# Patient Record
Sex: Female | Born: 1960 | Race: White | Hispanic: No | Marital: Married | Smoking: Never smoker
Health system: Southern US, Community
[De-identification: ages and names within clinical notes are randomized; demographics above are authoritative.]

## PROBLEM LIST (undated history)

## (undated) DIAGNOSIS — T4145XA Adverse effect of unspecified anesthetic, initial encounter: Secondary | ICD-10-CM

## (undated) DIAGNOSIS — R112 Nausea with vomiting, unspecified: Secondary | ICD-10-CM

## (undated) DIAGNOSIS — M6749 Ganglion, multiple sites: Secondary | ICD-10-CM

## (undated) DIAGNOSIS — M1990 Unspecified osteoarthritis, unspecified site: Secondary | ICD-10-CM

## (undated) DIAGNOSIS — H9319 Tinnitus, unspecified ear: Secondary | ICD-10-CM

## (undated) DIAGNOSIS — T8859XA Other complications of anesthesia, initial encounter: Secondary | ICD-10-CM

## (undated) DIAGNOSIS — D649 Anemia, unspecified: Secondary | ICD-10-CM

## (undated) DIAGNOSIS — I10 Essential (primary) hypertension: Secondary | ICD-10-CM

## (undated) DIAGNOSIS — Z9889 Other specified postprocedural states: Secondary | ICD-10-CM

## (undated) DIAGNOSIS — K219 Gastro-esophageal reflux disease without esophagitis: Secondary | ICD-10-CM

## (undated) HISTORY — PX: ENDOMETRIAL ABLATION: SHX621

## (undated) HISTORY — DX: Ganglion, multiple sites: M67.49

## (undated) HISTORY — DX: Tinnitus, unspecified ear: H93.19

## (undated) HISTORY — PX: ABDOMINAL HYSTERECTOMY: SHX81

---
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
## 2004-07-18 ENCOUNTER — Emergency Department: Payer: Self-pay | Admitting: Emergency Medicine

## 2005-11-22 ENCOUNTER — Ambulatory Visit: Payer: Self-pay | Admitting: Obstetrics and Gynecology

## 2006-07-21 ENCOUNTER — Ambulatory Visit: Payer: Self-pay | Admitting: Obstetrics and Gynecology

## 2007-04-18 ENCOUNTER — Ambulatory Visit: Payer: Self-pay | Admitting: Obstetrics and Gynecology

## 2008-04-17 LAB — HM MAMMOGRAPHY: HM Mammogram: NORMAL

## 2008-04-17 LAB — CONVERTED CEMR LAB: Pap Smear: NORMAL

## 2008-04-30 ENCOUNTER — Ambulatory Visit: Payer: Self-pay | Admitting: Obstetrics and Gynecology

## 2008-08-11 ENCOUNTER — Ambulatory Visit: Payer: Self-pay | Admitting: Family Medicine

## 2008-08-11 DIAGNOSIS — H9319 Tinnitus, unspecified ear: Secondary | ICD-10-CM | POA: Insufficient documentation

## 2008-09-10 ENCOUNTER — Ambulatory Visit: Payer: Self-pay

## 2009-07-03 ENCOUNTER — Ambulatory Visit: Payer: Self-pay | Admitting: Family Medicine

## 2009-07-03 DIAGNOSIS — M6749 Ganglion, multiple sites: Secondary | ICD-10-CM | POA: Insufficient documentation

## 2009-07-07 ENCOUNTER — Encounter: Payer: Self-pay | Admitting: Family Medicine

## 2009-08-06 ENCOUNTER — Ambulatory Visit: Payer: Self-pay | Admitting: Obstetrics and Gynecology

## 2010-08-16 ENCOUNTER — Encounter (INDEPENDENT_AMBULATORY_CARE_PROVIDER_SITE_OTHER): Payer: Self-pay | Admitting: *Deleted

## 2010-08-16 ENCOUNTER — Encounter: Payer: Self-pay | Admitting: Family Medicine

## 2010-08-16 ENCOUNTER — Telehealth: Payer: Self-pay | Admitting: Family Medicine

## 2010-08-25 NOTE — Progress Notes (Signed)
  Phone Note Other Incoming   Details for Reason: Old Records from Dr. Logan Bores Summary of Call: Notify pt she is overdue for a CPX and labs.. CMET, lipids Dx v77.91 please have her schedule.    Last labs per Dr. Logan Bores in 2009.  Initial call taken by: Kerby Nora MD,  August 16, 2010 10:12 AM  Follow-up for Phone Call        Letter mailed to patient with you recommendations Follow-up by: Benny Lennert CMA (AAMA),  August 16, 2010 10:18 AM

## 2010-08-25 NOTE — Letter (Signed)
Summary: Generic Letter  [place] [facility]  [address] [place], [place] [address]   Phone: [phone]  Fax: [phone]    08/16/2010      [facility] [address] [place], [place]  [address]    Dear Ms. Archambault,       After review of you old records Dr. Ermalene Searing suggest you have a physical with labs Cmet,lipids prior to the Physcial       Sincerely,   Benny Lennert CMA (AAMA)

## 2010-09-21 ENCOUNTER — Ambulatory Visit: Payer: Self-pay | Admitting: Obstetrics and Gynecology

## 2010-09-27 ENCOUNTER — Ambulatory Visit: Payer: Self-pay | Admitting: Obstetrics and Gynecology

## 2010-12-10 ENCOUNTER — Ambulatory Visit: Payer: Self-pay | Admitting: Family Medicine

## 2011-03-31 ENCOUNTER — Ambulatory Visit: Payer: Self-pay | Admitting: Obstetrics and Gynecology

## 2011-09-22 ENCOUNTER — Encounter: Payer: Self-pay | Admitting: Family Medicine

## 2011-09-22 ENCOUNTER — Ambulatory Visit (INDEPENDENT_AMBULATORY_CARE_PROVIDER_SITE_OTHER): Payer: Managed Care, Other (non HMO) | Admitting: Family Medicine

## 2011-09-22 VITALS — BP 131/90 | HR 64 | Temp 98.1°F | Wt 142.0 lb

## 2011-09-22 DIAGNOSIS — R1011 Right upper quadrant pain: Secondary | ICD-10-CM | POA: Insufficient documentation

## 2011-09-22 LAB — CBC WITH DIFFERENTIAL/PLATELET
Basophils Relative: 0.3 % (ref 0.0–3.0)
HCT: 41.8 % (ref 36.0–46.0)
Hemoglobin: 13.7 g/dL (ref 12.0–15.0)
Lymphocytes Relative: 31 % (ref 12.0–46.0)
Lymphs Abs: 2.2 10*3/uL (ref 0.7–4.0)
Monocytes Relative: 7.9 % (ref 3.0–12.0)
Neutro Abs: 4.1 10*3/uL (ref 1.4–7.7)
RBC: 4.86 Mil/uL (ref 3.87–5.11)
RDW: 13.3 % (ref 11.5–14.6)

## 2011-09-22 LAB — HEPATIC FUNCTION PANEL
ALT: 16 U/L (ref 0–35)
AST: 16 U/L (ref 0–37)
Albumin: 4.2 g/dL (ref 3.5–5.2)
Alkaline Phosphatase: 53 U/L (ref 39–117)
Total Protein: 7 g/dL (ref 6.0–8.3)

## 2011-09-22 MED ORDER — OMEPRAZOLE 40 MG PO CPDR: DELAYED_RELEASE_CAPSULE | ORAL | Status: DC

## 2011-09-22 NOTE — Progress Notes (Signed)
  Subjective:    Patient ID: Karla Boyer, female    DOB: [birth year], 51 y.o.   MRN: [id number]  HPI 51 yo with epigastric and RUQ pain. Has had intermittent symptoms for past year but getting progressively worse. Symptoms are worst at night. Does have some reflux symptoms. No fever but did have chills a few nights ago.  Does not drink ETOH, coffee, or smoke cigarettes.  Tums is not relieving pain.  Denies any dark/tary stools. No blood in stool.  No CP.  Patient Active Problem List  Diagnoses  . TINNITUS, RIGHT  . OTHER GANGLION&CYST OF SYNOVIUM TENDON&BURSA  . Right upper quadrant pain   Past Medical History  Diagnosis Date  . Other ganglion and cyst of synovium, tendon, and bursa   . Unspecified tinnitus    Past Surgical History  Procedure Date  . Endometrial ablation    History  Substance Use Topics  . Smoking status: Never Smoker   . Smokeless tobacco: Not on file  . Alcohol Use: No   Family History  Problem Relation Age of Onset  . Diabetes Maternal Grandfather   . Cancer Paternal Grandmother     bone, age 14's   No Known Allergies No current outpatient prescriptions on file prior to visit.   The PMH, PSH, Social History, Family History, Medications, and allergies have been reviewed in [facility], and have been updated if relevant.    Review of Systems See HPI   Objective:   Physical Exam BP 131/90  Pulse 64  Temp(Src) 98.1 F (36.7 C) (Oral)  Wt 142 lb (64.411 kg)  General:  Well-developed,well-nourished,in no acute distress; alert,appropriate and cooperative throughout examination Head:  normocephalic and atraumatic.   Eyes:  vision grossly intact, pupils equal, pupils round, and pupils reactive to light.   Ears:  R ear normal and L ear normal.   Nose:  no external deformity.   Mouth:  good dentition.   Neck:  No deformities, masses, or tenderness noted. Abdomen:  Bowel sounds positive,abdomen soft and non-tender without masses, organomegaly, neg  murphy's sign Neurologic:  alert & oriented X3 and gait normal.   Skin:  Intact without suspicious lesions or rashes Psych:  Cognition and judgment appear intact. Alert and cooperative with normal attention span and concentration. No apparent delusions, illusions, hallucinations    Assessment & Plan:   1. Right upper quadrant pain  US Abdomen Complete, CBC with Differential, Hepatic Function Panel   New with epigastric pain. GERD vs biliary colic. Omeprazole 40 mg nightly x 2 weeks. Will also check abdominal US to rule out biliary process- unlikely cholecystitis based on exam today. Check CBC and hepatic function panel as well.

## 2011-09-22 NOTE — Patient Instructions (Signed)
Good to see you. Please take Omeprazole as directed- 1 tablet nightly for two weeks and then call with an update. Please stop by to see Shirlee Limerick after you go to the lab.

## 2011-09-23 ENCOUNTER — Ambulatory Visit: Payer: Self-pay | Admitting: Family Medicine

## 2011-09-26 ENCOUNTER — Encounter: Payer: Self-pay | Admitting: Family Medicine

## 2011-09-30 ENCOUNTER — Ambulatory Visit: Payer: Self-pay | Admitting: Obstetrics and Gynecology

## 2012-02-15 ENCOUNTER — Ambulatory Visit (INDEPENDENT_AMBULATORY_CARE_PROVIDER_SITE_OTHER): Payer: Managed Care, Other (non HMO) | Admitting: Family Medicine

## 2012-02-15 ENCOUNTER — Encounter: Payer: Self-pay | Admitting: Family Medicine

## 2012-02-15 VITALS — BP 120/74 | HR 79 | Temp 97.6°F | Ht 63.5 in | Wt 144.2 lb

## 2012-02-15 DIAGNOSIS — J309 Allergic rhinitis, unspecified: Secondary | ICD-10-CM

## 2012-02-15 DIAGNOSIS — J0100 Acute maxillary sinusitis, unspecified: Secondary | ICD-10-CM

## 2012-02-15 MED ORDER — FLUTICASONE PROPIONATE 50 MCG/ACT NA SUSP: 2.0000 | Freq: Every day | NASAL | Status: DC

## 2012-02-15 MED ORDER — AMOXICILLIN-POT CLAVULANATE 875-125 MG PO TABS: 1.0000 | ORAL_TABLET | Freq: Two times a day (BID) | ORAL | Status: AC

## 2012-02-15 NOTE — Progress Notes (Signed)
   Nature conservation officer at [facility] [address] [place] [address] Phone: [phone] Fax: [phone]  Date:  02/15/2012   Name:  Karla Boyer   DOB:  January 12, 1961   MRN:  [id number]  PCP:  Kerby Nora, MD    Chief Complaint: Sinusitis   History of Present Illness:  Vineta Buttrey is a 51 y.o. very pleasant female patient who presents with the following:  Has been battling symptoms for > 1 month. Some pain in the teeth.  Has had some nasal allergies in the past.   Sinus Pain: Patient complains of congestion, facial pain, headache described as frontal / max, nasal congestion, purulent nasal discharge, sinus pressure, sore throat and tooth pain. Symptoms include above with no fever, chills, night sweats or weight loss. Onset of symptoms was 1 month ago, gradually worsening since that time. She is drinking plenty of fluids.  Past history is significant for allergic rhinitis. Patient is non-smoker   Past Medical History, Surgical History, Social History, Family History, Problem List, Medications, and Allergies have been reviewed and updated if relevant.  No current outpatient prescriptions on file prior to visit.    Review of Systems: ROS: GEN: Acute illness details above GI: Tolerating PO intake GU: maintaining adequate hydration and urination Pulm: No SOB Interactive and getting along well at home.  Otherwise, ROS is as per the HPI.   Physical Examination: Filed Vitals:   02/15/12 0926  BP: 120/74  Pulse: 79  Temp: 97.6 F (36.4 C)   Filed Vitals:   02/15/12 0926  Height: 5' 3.5" (1.613 m)  Weight: 144 lb 4 oz (65.431 kg)   Body mass index is 25.15 kg/(m^2). Ideal Body Weight: Weight in (lb) to have BMI = 25: 143.1    Gen: WDWN, NAD; alert,appropriate and cooperative throughout exam  HEENT: Normocephalic and atraumatic. Throat clear, w/o exudate, no LAD, R TM clear, L TM - good landmarks, No fluid present. rhinnorhea.  Left frontal and maxillary sinuses:  nonTender Right frontal and maxillary sinuses: TenderRIGHT max  Neck: No ant or post LAD CV: RRR, No M/G/R Pulm: Breathing comfortably in no resp distress. no w/c/r Abd: S,NT,ND,+BS Extr: no c/c/e Psych: full affect, pleasant   Assessment and Plan:  1. Maxillary sinusitis, acute   2. Allergic rhinitis    Acute sinusitis: ABX as below.  Refer to the patient instructions sections for details of plan shared with patient.  Reviewed symptomatic care as well as ABX in this case.   flonase trial and sudafed  Orders Today:  No orders of the defined types were placed in this encounter.    Medications Today: (Includes new updates added during medication reconciliation) Meds ordered this encounter  Medications  . amoxicillin-clavulanate (AUGMENTIN) 875-125 MG per tablet    Sig: Take 1 tablet by mouth 2 (two) times daily.    Dispense:  20 tablet    Refill:  0  . fluticasone (FLONASE) 50 MCG/ACT nasal spray    Sig: Place 2 sprays into the nose daily.    Dispense:  16 g    Refill:  12     Hannah Beat, MD

## 2012-10-24 ENCOUNTER — Ambulatory Visit: Payer: Self-pay | Admitting: Otolaryngology

## 2013-04-17 ENCOUNTER — Ambulatory Visit (INDEPENDENT_AMBULATORY_CARE_PROVIDER_SITE_OTHER): Payer: Managed Care, Other (non HMO) | Admitting: Internal Medicine

## 2013-04-17 DIAGNOSIS — Z299 Encounter for prophylactic measures, unspecified: Secondary | ICD-10-CM

## 2013-04-17 DIAGNOSIS — Z23 Encounter for immunization: Secondary | ICD-10-CM

## 2013-04-17 DIAGNOSIS — Z789 Other specified health status: Secondary | ICD-10-CM

## 2013-04-17 MED ORDER — ATOVAQUONE-PROGUANIL HCL 250-100 MG PO TABS: 1.0000 | ORAL_TABLET | Freq: Every day | ORAL | Status: DC

## 2013-04-17 MED ORDER — AZITHROMYCIN 500 MG PO TABS: 500.0000 mg | ORAL_TABLET | Freq: Every day | ORAL | Status: DC

## 2013-04-17 NOTE — Progress Notes (Signed)
[facility]   RFV: travel to [place], [place] from oct 29th for 12 day business trip Subjective:    Patient ID: Karla Boyer, female    DOB: August 03, 1960, 52 y.o.   MRN: [id number]  HPI Kayelynn is a 52yo F with no significant past med history, not on any medications, no allergies. Going to [place] for the first time.  Previous travel to the [place]  Review of Systems     Objective:   Physical Exam        Assessment & Plan:  Travel vaccines = will give hep A, typhoid, tdap, and influenza  Malaria proph = wil give #21 tabs of malarone  Traveler's diarrhea = will give rx for azithromycin

## 2013-04-26 ENCOUNTER — Encounter: Payer: Managed Care, Other (non HMO) | Admitting: Internal Medicine

## 2013-04-29 ENCOUNTER — Ambulatory Visit: Payer: Self-pay | Admitting: Obstetrics and Gynecology

## 2013-04-30 ENCOUNTER — Encounter: Payer: Self-pay | Admitting: Family Medicine

## 2013-04-30 ENCOUNTER — Ambulatory Visit (INDEPENDENT_AMBULATORY_CARE_PROVIDER_SITE_OTHER): Payer: Managed Care, Other (non HMO) | Admitting: Family Medicine

## 2013-04-30 VITALS — BP 140/90 | HR 69 | Temp 98.1°F | Ht 63.5 in | Wt 141.5 lb

## 2013-04-30 DIAGNOSIS — R030 Elevated blood-pressure reading, without diagnosis of hypertension: Secondary | ICD-10-CM

## 2013-04-30 DIAGNOSIS — I10 Essential (primary) hypertension: Secondary | ICD-10-CM | POA: Insufficient documentation

## 2013-04-30 LAB — POCT URINALYSIS DIPSTICK
Bilirubin, UA: NEGATIVE
Glucose, UA: NEGATIVE
Leukocytes, UA: NEGATIVE
Nitrite, UA: NEGATIVE
Protein, UA: NEGATIVE
Spec Grav, UA: <=1.005
pH, UA: 7

## 2013-04-30 NOTE — Assessment & Plan Note (Addendum)
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 Will eval  for secondary causes, end organ damage and risk factors. Send for fasting labs.  Get cuff follow at home . Return in 2 weeks for re-eval and possible need for medication.  EKG nml, UA no protein.

## 2013-04-30 NOTE — Patient Instructions (Signed)
Return for fasting labs.  Follow BP at home, record.  Work on healthy heart diet... Can look on american heart association website for more info on diet for HTN.  Follow up BP in 2 weeks.

## 2013-04-30 NOTE — Progress Notes (Signed)
  Subjective:    Patient ID: Karla Boyer, female    DOB: 1961/06/11, 52 y.o.   MRN: [id number]  HPI   52 year old female presents with recently elevated BPS here today and [facility], at other MD ... 137/85-140/92 in last month. She has been feeling well overall. No headache, no bliurred vision, no numbness, no weakness, no dizziness.  No cp, and SOB. No edema.    Slight increase in stress but not much.  No weight gain.  No changes in eating habits.  Wt Readings from Last 3 Encounters:  04/30/13 141 lb 8 oz (64.184 kg)  02/15/12 144 lb 4 oz (65.431 kg)  09/22/11 142 lb (64.411 kg)   Last Gyn visit for CPX in last week. Nml pelvic, nml  Pap. Pending mammogram. Lipids were good per pt. No discussion on colon cancer screening.  Walking 3 days a week 30 mins. Avoids salt, eats out some.   Family history: mother with HTN.      Review of Systems  Constitutional: Negative for fever and fatigue.  HENT: Negative for ear pain.   Eyes: Negative for pain.  Respiratory: Negative for chest tightness and shortness of breath.   Cardiovascular: Negative for chest pain, palpitations and leg swelling.  Gastrointestinal: Negative for abdominal pain.  Genitourinary: Negative for dysuria.       Objective:   Physical Exam  Constitutional: Vital signs are normal. She appears well-developed and well-nourished. She is cooperative.  Non-toxic appearance. She does not appear ill. No distress.  HENT:  Head: Normocephalic.  Right Ear: Hearing, tympanic membrane, external ear and ear canal normal. Tympanic membrane is not erythematous, not retracted and not bulging.  Left Ear: Hearing, tympanic membrane, external ear and ear canal normal. Tympanic membrane is not erythematous, not retracted and not bulging.  Nose: No mucosal edema or rhinorrhea. Right sinus exhibits no maxillary sinus tenderness and no frontal sinus tenderness. Left sinus exhibits no maxillary sinus tenderness and no frontal  sinus tenderness.  Mouth/Throat: Uvula is midline, oropharynx is clear and moist and mucous membranes are normal.  Eyes: Conjunctivae, EOM and lids are normal. Pupils are equal, round, and reactive to light. Lids are everted and swept, no foreign bodies found.  No papilledema, no AV nicking or changes from BP elevation.  Neck: Trachea normal and normal range of motion. Neck supple. Carotid bruit is not present. No mass and no thyromegaly present.  Cardiovascular: Normal rate, regular rhythm, S1 normal, S2 normal, normal heart sounds, intact distal pulses and normal pulses.  Exam reveals no gallop and no friction rub.   No murmur heard. Pulmonary/Chest: Effort normal and breath sounds normal. Not tachypneic. No respiratory distress. She has no decreased breath sounds. She has no wheezes. She has no rhonchi. She has no rales.  Abdominal: Soft. Normal appearance and bowel sounds are normal. There is no tenderness.  Neurological: She is alert.  Skin: Skin is warm, dry and intact. No rash noted.  Psychiatric: Her speech is normal and behavior is normal. Judgment and thought content normal. Her mood appears not anxious. Cognition and memory are normal. She does not exhibit a depressed mood.          Assessment & Plan:

## 2013-05-01 ENCOUNTER — Other Ambulatory Visit (INDEPENDENT_AMBULATORY_CARE_PROVIDER_SITE_OTHER): Payer: Managed Care, Other (non HMO)

## 2013-05-01 DIAGNOSIS — R030 Elevated blood-pressure reading, without diagnosis of hypertension: Secondary | ICD-10-CM

## 2013-05-01 LAB — COMPREHENSIVE METABOLIC PANEL
ALT: 19 U/L (ref 0–35)
Albumin: 4.1 g/dL (ref 3.5–5.2)
Alkaline Phosphatase: 57 U/L (ref 39–117)
BUN: 11 mg/dL (ref 6–23)
CO2: 28 mEq/L (ref 19–32)
Calcium: 9.3 mg/dL (ref 8.4–10.5)
Chloride: 103 mEq/L (ref 96–112)
GFR: 72.22 mL/min (ref >60.00)
Glucose, Bld: 104 mg/dL — ABNORMAL HIGH (ref 70–99)
Potassium: 4.6 mEq/L (ref 3.5–5.1)
Sodium: 139 mEq/L (ref 135–145)
Total Protein: 7.2 g/dL (ref 6.0–8.3)

## 2013-05-01 LAB — CBC WITH DIFFERENTIAL/PLATELET
Basophils Absolute: 0 10*3/uL (ref 0.0–0.1)
Basophils Relative: 0.5 % (ref 0.0–3.0)
HCT: 44.1 % (ref 36.0–46.0)
Lymphs Abs: 2.2 10*3/uL (ref 0.7–4.0)
Monocytes Relative: 8.3 % (ref 3.0–12.0)
Neutrophils Relative %: 59.9 % (ref 43.0–77.0)
Platelets: 268 10*3/uL (ref 150.0–400.0)
RDW: 13.5 % (ref 11.5–14.6)

## 2013-05-01 LAB — TSH: TSH: 1.59 u[IU]/mL (ref 0.35–5.50)

## 2013-05-08 ENCOUNTER — Other Ambulatory Visit: Payer: Self-pay | Admitting: Family Medicine

## 2013-05-08 DIAGNOSIS — Z13220 Encounter for screening for lipoid disorders: Secondary | ICD-10-CM

## 2013-05-09 ENCOUNTER — Encounter: Payer: Self-pay | Admitting: *Deleted

## 2013-05-09 ENCOUNTER — Other Ambulatory Visit (INDEPENDENT_AMBULATORY_CARE_PROVIDER_SITE_OTHER): Payer: Managed Care, Other (non HMO)

## 2013-05-09 DIAGNOSIS — R030 Elevated blood-pressure reading, without diagnosis of hypertension: Secondary | ICD-10-CM

## 2013-05-09 DIAGNOSIS — Z13220 Encounter for screening for lipoid disorders: Secondary | ICD-10-CM

## 2013-05-09 LAB — LIPID PANEL
HDL: 39.5 mg/dL (ref >39.00)
LDL Cholesterol: 108 mg/dL — ABNORMAL HIGH (ref 0–99)
Total CHOL/HDL Ratio: 4

## 2013-05-14 ENCOUNTER — Encounter: Payer: Self-pay | Admitting: Family Medicine

## 2013-05-14 ENCOUNTER — Ambulatory Visit (INDEPENDENT_AMBULATORY_CARE_PROVIDER_SITE_OTHER): Payer: Managed Care, Other (non HMO) | Admitting: Family Medicine

## 2013-05-14 VITALS — BP 116/76 | HR 72 | Temp 97.9°F | Ht 63.5 in | Wt 142.0 lb

## 2013-05-14 DIAGNOSIS — R7303 Prediabetes: Secondary | ICD-10-CM

## 2013-05-14 DIAGNOSIS — R030 Elevated blood-pressure reading, without diagnosis of hypertension: Secondary | ICD-10-CM

## 2013-05-14 DIAGNOSIS — R7309 Other abnormal glucose: Secondary | ICD-10-CM

## 2013-05-14 NOTE — Assessment & Plan Note (Signed)
Counseled on lifestyle changes.. Low carb diet.

## 2013-05-14 NOTE — Progress Notes (Signed)
  Subjective:    Patient ID: Karla Boyer, female    DOB: Jul 01, 1961, 52 y.o.   MRN: [id number]  HPI  52 year old female presetns for follow up BP.   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 Will eval for secondary causes, end organ damage and risk factors. Send for fasting labs.  Get cuff follow at home . Return in 2 weeks for re-eval and possible need for medication.  EKG nml, UA no protein.  Labs showed great control cholesterol LDL < 130.  Nml cbc, CMET nml except mild prediabtes.  Today pt reports she has not taken BP since she was here last. She has been trying to eat healthier.  She is working on lower carb and lower sodium diet.  Wt Readings from Last 3 Encounters:  05/14/13 142 lb (64.411 kg)  04/30/13 141 lb 8 oz (64.184 kg)  02/15/12 144 lb 4 oz (65.431 kg)       Review of Systems  Constitutional: Negative for fever and fatigue.  HENT: Negative for ear pain.   Eyes: Negative for pain.  Respiratory: Negative for chest tightness and shortness of breath.   Cardiovascular: Negative for chest pain, palpitations and leg swelling.  Gastrointestinal: Negative for abdominal pain.  Genitourinary: Negative for dysuria.       Objective:   Physical Exam  Constitutional: Vital signs are normal. She appears well-developed and well-nourished. She is cooperative.  Non-toxic appearance. She does not appear ill. No distress.  HENT:  Head: Normocephalic.  Right Ear: Hearing, tympanic membrane, external ear and ear canal normal. Tympanic membrane is not erythematous, not retracted and not bulging.  Left Ear: Hearing, tympanic membrane, external ear and ear canal normal. Tympanic membrane is not erythematous, not retracted and not bulging.  Nose: No mucosal edema or rhinorrhea. Right sinus exhibits no maxillary sinus tenderness and no frontal sinus tenderness. Left sinus exhibits no maxillary sinus tenderness and no frontal sinus  tenderness.  Mouth/Throat: Uvula is midline, oropharynx is clear and moist and mucous membranes are normal.  Eyes: Conjunctivae, EOM and lids are normal. Pupils are equal, round, and reactive to light. Lids are everted and swept, no foreign bodies found.  Neck: Trachea normal and normal range of motion. Neck supple. Carotid bruit is not present. No mass and no thyromegaly present.  Cardiovascular: Normal rate, regular rhythm, S1 normal, S2 normal, normal heart sounds, intact distal pulses and normal pulses.  Exam reveals no gallop and no friction rub.   No murmur heard. Pulmonary/Chest: Effort normal and breath sounds normal. Not tachypneic. No respiratory distress. She has no decreased breath sounds. She has no wheezes. She has no rhonchi. She has no rales.  Abdominal: Soft. Normal appearance and bowel sounds are normal. There is no tenderness.  Neurological: She is alert.  Skin: Skin is warm, dry and intact. No rash noted.  Psychiatric: Her speech is normal and behavior is normal. Judgment and thought content normal. Her mood appears not anxious. Cognition and memory are normal. She does not exhibit a depressed mood.          Assessment & Plan:

## 2013-05-14 NOTE — Assessment & Plan Note (Signed)
Improved control with  Healthier eating. Continue to follow at home. Recheck in office in 6 months.

## 2013-05-14 NOTE — Patient Instructions (Signed)
Return for BP check in 6 months. Keep working on exercise and  healthy eating habits.

## 2013-05-20 ENCOUNTER — Telehealth: Payer: Self-pay | Admitting: *Deleted

## 2013-05-20 NOTE — Telephone Encounter (Signed)
Sent email to Anise Salvo to delete [employer] charge of $50 from [employer] account.  She has already paid for it.

## 2013-11-12 ENCOUNTER — Ambulatory Visit: Payer: Managed Care, Other (non HMO) | Admitting: Family Medicine

## 2013-11-12 DIAGNOSIS — Z0289 Encounter for other administrative examinations: Secondary | ICD-10-CM

## 2014-02-21 ENCOUNTER — Encounter: Payer: Self-pay | Admitting: Family Medicine

## 2014-02-21 ENCOUNTER — Ambulatory Visit (INDEPENDENT_AMBULATORY_CARE_PROVIDER_SITE_OTHER): Payer: Managed Care, Other (non HMO) | Admitting: Family Medicine

## 2014-02-21 VITALS — BP 152/96 | HR 69 | Temp 98.3°F | Ht 63.5 in | Wt 141.5 lb

## 2014-02-21 DIAGNOSIS — I10 Essential (primary) hypertension: Secondary | ICD-10-CM

## 2014-02-21 DIAGNOSIS — R0789 Other chest pain: Secondary | ICD-10-CM

## 2014-02-21 MED ORDER — LOSARTAN POTASSIUM-HCTZ 50-12.5 MG PO TABS: 1.0000 | ORAL_TABLET | Freq: Every day | ORAL | Status: DC

## 2014-02-21 NOTE — Assessment & Plan Note (Addendum)
Likely due to HTN and stress.  EKG: NSR, no ST changes, no LVH.

## 2014-02-21 NOTE — Assessment & Plan Note (Signed)
Start losartan HCTZ. Encouraged exercise, weight loss, healthy eating habits. Follow up in 7-10 days for BP re-eval and CR check on ARB.

## 2014-02-21 NOTE — Progress Notes (Signed)
   Subjective:    Patient ID: Karla Boyer, female    DOB: [birth year], 53 y.o.   MRN: [id number]  HPI  53 year old female seen last year with elevated BP.  EKG nml, UA no protein. CBC, lipids and CMET at goal 04/2013. Her BP initially improved with lfestyle cahnge, but she has now noticed an increase back up.  She has noted BP 146-155/91-103  In last few weeks she had increase in stress. She started feeling ill. Chest tightness, neck tightness.  Intermittant, occurs with stress. She can feel BP rising. Mild SOB.  Occ heart racing.  No exercise associated chest pain.  Can hear heart beating in both ears. No swelling in ankle.   She has started on DASH diet.  Mother with HTN.   Wt Readings from Last 3 Encounters:  02/21/14 141 lb 8 oz (64.184 kg)  05/14/13 142 lb (64.411 kg)  04/30/13 141 lb 8 oz (64.184 kg)        Review of Systems  Constitutional: Negative for fever and fatigue.  HENT: Negative for ear pain.   Eyes: Negative for pain.  Respiratory: Positive for shortness of breath. Negative for chest tightness.   Cardiovascular: Positive for chest pain and palpitations. Negative for leg swelling.  Gastrointestinal: Negative for abdominal pain.  Genitourinary: Negative for dysuria.       Objective:   Physical Exam  Constitutional: Vital signs are normal. She appears well-developed and well-nourished. She is cooperative.  Non-toxic appearance. She does not appear ill. No distress.  HENT:  Head: Normocephalic.  Right Ear: Hearing, tympanic membrane, external ear and ear canal normal. Tympanic membrane is not erythematous, not retracted and not bulging.  Left Ear: Hearing, tympanic membrane, external ear and ear canal normal. Tympanic membrane is not erythematous, not retracted and not bulging.  Nose: No mucosal edema or rhinorrhea. Right sinus exhibits no maxillary sinus tenderness and no frontal sinus tenderness. Left sinus exhibits no maxillary sinus tenderness and no  frontal sinus tenderness.  Mouth/Throat: Uvula is midline, oropharynx is clear and moist and mucous membranes are normal.  Eyes: Conjunctivae, EOM and lids are normal. Pupils are equal, round, and reactive to light. Lids are everted and swept, no foreign bodies found.  Neck: Trachea normal and normal range of motion. Neck supple. Carotid bruit is not present. No mass and no thyromegaly present.  Cardiovascular: Normal rate, regular rhythm, S1 normal, S2 normal, normal heart sounds, intact distal pulses and normal pulses.  Exam reveals no gallop and no friction rub.   No murmur heard. Pulmonary/Chest: Effort normal and breath sounds normal. Not tachypneic. No respiratory distress. She has no decreased breath sounds. She has no wheezes. She has no rhonchi. She has no rales.  Abdominal: Soft. Normal appearance and bowel sounds are normal. There is no tenderness.  Neurological: She is alert.  Skin: Skin is warm, dry and intact. No rash noted.  Psychiatric: Her speech is normal and behavior is normal. Judgment and thought content normal. Her mood appears not anxious. Cognition and memory are normal. She does not exhibit a depressed mood.          Assessment & Plan:

## 2014-02-21 NOTE — Patient Instructions (Addendum)
Start measuring BP at home at least daily.  Record measurements.  Start losartan HCTZ.  Return in 1-2 weeks for a BP follow up appt .   Hypertension Hypertension, commonly called high blood pressure, is when the force of blood pumping through your arteries is too strong. Your arteries are the blood vessels that carry blood from your heart throughout your body. A blood pressure reading consists of a higher number over a lower number, such as 110/72. The higher number (systolic) is the pressure inside your arteries when your heart pumps. The lower number (diastolic) is the pressure inside your arteries when your heart relaxes. Ideally you want your blood pressure below 120/80. Hypertension forces your heart to work harder to pump blood. Your arteries may become narrow or stiff. Having hypertension puts you at risk for heart disease, stroke, and other problems.  RISK FACTORS Some risk factors for high blood pressure are controllable. Others are not.  Risk factors you cannot control include:   Race. You may be at higher risk if you are African American.  Age. Risk increases with age.  Gender. Men are at higher risk than women before age 38 years. After age 75, women are at higher risk than men. Risk factors you can control include:  Not getting enough exercise or physical activity.  Being overweight.  Getting too much fat, sugar, calories, or salt in your diet.  Drinking too much alcohol. SIGNS AND SYMPTOMS Hypertension does not usually cause signs or symptoms. Extremely high blood pressure (hypertensive crisis) may cause headache, anxiety, shortness of breath, and nosebleed. DIAGNOSIS  To check if you have hypertension, your health care provider will measure your blood pressure while you are seated, with your arm held at the level of your heart. It should be measured at least twice using the same arm. Certain conditions can cause a difference in blood pressure between your right and left  arms. A blood pressure reading that is higher than normal on one occasion does not mean that you need treatment. If one blood pressure reading is high, ask your health care provider about having it checked again. TREATMENT  Treating high blood pressure includes making lifestyle changes and possibly taking medicine. Living a healthy lifestyle can help lower high blood pressure. You may need to change some of your habits. Lifestyle changes may include:  Following the DASH diet. This diet is high in fruits, vegetables, and whole grains. It is low in salt, red meat, and added sugars.  Getting at least 2 hours of brisk physical activity every week.  Losing weight if necessary.  Not smoking.  Limiting alcoholic beverages.  Learning ways to reduce stress. If lifestyle changes are not enough to get your blood pressure under control, your health care provider may prescribe medicine. You may need to take more than one. Work closely with your health care provider to understand the risks and benefits. HOME CARE INSTRUCTIONS  Have your blood pressure rechecked as directed by your health care provider.   Take medicines only as directed by your health care provider. Follow the directions carefully. Blood pressure medicines must be taken as prescribed. The medicine does not work as well when you skip doses. Skipping doses also puts you at risk for problems.   Do not smoke.   Monitor your blood pressure at home as directed by your health care provider. SEEK MEDICAL CARE IF:   You think you are having a reaction to medicines taken.  You have recurrent headaches or  feel dizzy.  You have swelling in your ankles.  You have trouble with your vision. SEEK IMMEDIATE MEDICAL CARE IF:  You develop a severe headache or confusion.  You have unusual weakness, numbness, or feel faint.  You have severe chest or abdominal pain.  You vomit repeatedly.  You have trouble breathing. MAKE SURE YOU:    Understand these instructions.  Will watch your condition.  Will get help right away if you are not doing well or get worse. Document Released: 07/04/2005 Document Revised: 11/18/2013 Document Reviewed: 04/26/2013 [facility] Patient Information 2015 [place], [place]. This information is not intended to replace advice given to you by your health care provider. Make sure you discuss any questions you have with your health care provider.

## 2014-02-21 NOTE — Progress Notes (Signed)
Pre visit review using our clinic review tool, if applicable. No additional management support is needed unless otherwise documented below in the visit note. 

## 2014-02-24 ENCOUNTER — Telehealth: Payer: Self-pay | Admitting: Family Medicine

## 2014-02-24 NOTE — Telephone Encounter (Signed)
Relevant patient education mailed to patient.  

## 2014-03-07 ENCOUNTER — Ambulatory Visit (INDEPENDENT_AMBULATORY_CARE_PROVIDER_SITE_OTHER): Payer: Managed Care, Other (non HMO) | Admitting: Family Medicine

## 2014-03-07 ENCOUNTER — Encounter: Payer: Self-pay | Admitting: Family Medicine

## 2014-03-07 ENCOUNTER — Ambulatory Visit: Payer: Managed Care, Other (non HMO) | Admitting: Family Medicine

## 2014-03-07 VITALS — BP 116/79 | HR 79 | Temp 98.2°F | Ht 63.5 in | Wt 142.5 lb

## 2014-03-07 DIAGNOSIS — I10 Essential (primary) hypertension: Secondary | ICD-10-CM

## 2014-03-07 MED ORDER — LOSARTAN POTASSIUM-HCTZ 50-12.5 MG PO TABS: 1.0000 | ORAL_TABLET | Freq: Every day | ORAL | Status: DC

## 2014-03-07 MED ORDER — LOSARTAN POTASSIUM-HCTZ 50-12.5 MG PO TABS
1.0000 | ORAL_TABLET | Freq: Every day | ORAL | Status: DC
Start: 2014-03-07 — End: 2015-04-06

## 2014-03-07 NOTE — Progress Notes (Signed)
   Subjective:    Patient ID: Karla Boyer, female    DOB: 10/29/1960, 53 y.o.   MRN: [id number]  HPI 53 year old female presents for Bp follow up.  She was seen  On 02/21/2014 and started on losartan HCTZ.  Since then she reports her BP is much better... 100-117/71-80. No SE to the medication. Chest pressure has resolved.  BP Readings from Last 3 Encounters:  03/07/14 116/79  02/21/14 152/96  05/14/13 116/76     Review of Systems  Constitutional: Negative for fever and fatigue.  HENT: Negative for ear pain.   Eyes: Negative for pain.  Respiratory: Negative for chest tightness and shortness of breath.   Cardiovascular: Negative for chest pain, palpitations and leg swelling.  Gastrointestinal: Negative for abdominal pain.  Genitourinary: Negative for dysuria.       Objective:   Physical Exam  Constitutional: Vital signs are normal. She appears well-developed and well-nourished. She is cooperative.  Non-toxic appearance. She does not appear ill. No distress.  HENT:  Head: Normocephalic.  Right Ear: Hearing, tympanic membrane, external ear and ear canal normal. Tympanic membrane is not erythematous, not retracted and not bulging.  Left Ear: Hearing, tympanic membrane, external ear and ear canal normal. Tympanic membrane is not erythematous, not retracted and not bulging.  Nose: No mucosal edema or rhinorrhea. Right sinus exhibits no maxillary sinus tenderness and no frontal sinus tenderness. Left sinus exhibits no maxillary sinus tenderness and no frontal sinus tenderness.  Mouth/Throat: Uvula is midline, oropharynx is clear and moist and mucous membranes are normal.  Eyes: Conjunctivae, EOM and lids are normal. Pupils are equal, round, and reactive to light. Lids are everted and swept, no foreign bodies found.  Neck: Trachea normal and normal range of motion. Neck supple. Carotid bruit is not present. No mass and no thyromegaly present.  Cardiovascular: Normal rate, regular  rhythm, S1 normal, S2 normal, normal heart sounds, intact distal pulses and normal pulses.  Exam reveals no gallop and no friction rub.   No murmur heard. Pulmonary/Chest: Effort normal and breath sounds normal. Not tachypneic. No respiratory distress. She has no decreased breath sounds. She has no wheezes. She has no rhonchi. She has no rales.  Abdominal: Soft. Normal appearance and bowel sounds are normal. There is no tenderness.  Neurological: She is alert.  Skin: Skin is warm, dry and intact. No rash noted.  Psychiatric: Her speech is normal and behavior is normal. Judgment and thought content normal. Her mood appears not anxious. Cognition and memory are normal. She does not exhibit a depressed mood.          Assessment & Plan:

## 2014-03-07 NOTE — Progress Notes (Signed)
Pre visit review using our clinic review tool, if applicable. No additional management support is needed unless otherwise documented below in the visit note. 

## 2014-03-07 NOTE — Patient Instructions (Addendum)
Follow BP at home, goal < 140/90. Call if < 100/60 and associated with SE like dizziness. Stop at lab on way out.

## 2014-03-07 NOTE — Assessment & Plan Note (Signed)
Well controlled on losartan HCTZ.  Follow at home.  Check Cr for ARF SE.

## 2014-03-08 LAB — BASIC METABOLIC PANEL
BUN: 10 mg/dL (ref 6–23)
CHLORIDE: 101 meq/L (ref 96–112)
CO2: 28 mEq/L (ref 19–32)
CREATININE: 0.8 mg/dL (ref 0.4–1.2)
Calcium: 9.1 mg/dL (ref 8.4–10.5)
GFR: 77 mL/min (ref >60.00)
Glucose, Bld: 99 mg/dL (ref 70–99)
Potassium: 4.3 mEq/L (ref 3.5–5.1)
Sodium: 140 mEq/L (ref 135–145)

## 2014-10-30 ENCOUNTER — Telehealth: Payer: Self-pay | Admitting: Family Medicine

## 2014-10-30 NOTE — Telephone Encounter (Signed)
She is pass due to an appointment.  Please call and schedule an office visit with Dr. Diona Browner.

## 2014-10-30 NOTE — Telephone Encounter (Signed)
Pt needs a stool colon screening card for her insurance.  Can pt pick this up at her convenience? Work number is [phone]. Thanks.

## 2014-10-30 NOTE — Telephone Encounter (Signed)
Tried calling pt, no answer.

## 2014-10-31 NOTE — Telephone Encounter (Signed)
Appointment 4/19  Pt aware

## 2014-11-04 ENCOUNTER — Encounter: Payer: Self-pay | Admitting: Family Medicine

## 2014-11-04 ENCOUNTER — Ambulatory Visit (INDEPENDENT_AMBULATORY_CARE_PROVIDER_SITE_OTHER): Payer: BLUE CROSS/BLUE SHIELD | Admitting: Family Medicine

## 2014-11-04 VITALS — BP 100/72 | HR 92 | Temp 97.9°F | Ht 63.5 in | Wt 143.2 lb

## 2014-11-04 DIAGNOSIS — Z1211 Encounter for screening for malignant neoplasm of colon: Secondary | ICD-10-CM

## 2014-11-04 NOTE — Progress Notes (Signed)
Pre visit review using our clinic review tool, if applicable. No additional management support is needed unless otherwise documented below in the visit note. 

## 2014-11-04 NOTE — Progress Notes (Signed)
Subjective:     Patient ID: Karla Boyer, female   DOB: 1961-02-01, 54 y.o.   MRN: [id number]  HPI Karla Boyer is a 54 y/o F with hx of HTN and prediabetes here for follow up.  Hypertension:    Using medication without problems or lightheadedness :losartan-HCTZ once daily Chest pain with exertion: none Edema: none Short of breath: none Average home BPs: around 110/75 Other issues: none  Karla Boyer is also due for colon cancer screening. She is interested in doing fecal occult blood screening with stool cards.  Review of Systems  Respiratory: Negative for shortness of breath.   Cardiovascular: Negative for chest pain and leg swelling.       Objective:   Physical Exam  Constitutional: She appears well-developed and well-nourished.  HENT:  Head: Normocephalic and atraumatic.  Neck: No thyroid mass and no thyromegaly present.  Cardiovascular: Normal rate, regular rhythm, normal heart sounds and intact distal pulses.  Exam reveals no gallop and no friction rub.   No murmur heard. No peripheral edema  Pulmonary/Chest: Effort normal and breath sounds normal.  Lymphadenopathy:    She has no cervical adenopathy.  Skin: Skin is warm. No rash noted.  Psychiatric: She has a normal mood and affect. Her behavior is normal.       Assessment:     1. HTN: Well controlled.  2: Colon cancer screening: Due    Plan:     1. HTN: Continue current medication. Return in 12 months for follow up.  2: Colon cancer screening: Will mail in stool cards for fecal occult blood screening. Repeat in 1 year.     Dante Gang served as Education administrator for Dr. Diona Browner 11/04/14 7:15 am      .Patient seen and examined. Med student acted as Education administrator only.  HPI/ROS/PE and assessment/plan created  By MD and scribed by med student.  Eliezer Lofts MD

## 2014-11-04 NOTE — Patient Instructions (Signed)
Stop at lab on way out to pick up stool cards Continue current dose of blood pressure medicine Continue healthy eating and regular exercise Return in 1 year for follow up HTN

## 2014-11-28 ENCOUNTER — Ambulatory Visit (INDEPENDENT_AMBULATORY_CARE_PROVIDER_SITE_OTHER): Payer: BLUE CROSS/BLUE SHIELD | Admitting: Family Medicine

## 2014-11-28 ENCOUNTER — Encounter: Payer: Self-pay | Admitting: Family Medicine

## 2014-11-28 VITALS — BP 131/86 | HR 73 | Temp 97.9°F | Ht 63.5 in | Wt 145.5 lb

## 2014-11-28 DIAGNOSIS — R590 Localized enlarged lymph nodes: Secondary | ICD-10-CM | POA: Insufficient documentation

## 2014-11-28 DIAGNOSIS — R599 Enlarged lymph nodes, unspecified: Secondary | ICD-10-CM | POA: Diagnosis not present

## 2014-11-28 MED ORDER — AMOXICILLIN 500 MG PO CAPS: 500.0000 mg | ORAL_CAPSULE | Freq: Two times a day (BID) | ORAL | Status: DC

## 2014-11-28 NOTE — Progress Notes (Signed)
Subjective:     Patient ID: Karla Boyer, female   DOB: 1961-01-02, 54 y.o.   MRN: [id number]  HPI [name] is a 54 y/o F presenting with cervical adenopathy.  She had an infection underneath one of her bottom left molars one month ago and had to have the tooth pulled. No problems since then, until yesterday. While chewing yesterday, says a gland on the left side of her neck "popped out", went back down, then became prominent again every time she has eaten since yesterday. She has a picture on her phone that does show prominent node left neck. Says she could feel the gland swelling as she was eating. It is painful. No fevers, no toothache.  Only started chewing on left side of mouth again a week ago since the extraction.   Also has dull, achy pain radiating down her shoulder, says it is similar to the pain she experienced with the tooth infection. She is worried that her tooth infection is back.    Review of Systems  Constitutional: Negative for fever.  HENT: Positive for sore throat. Negative for congestion.   Respiratory: Negative for cough.        Objective:   Physical Exam  Constitutional: She appears well-developed and well-nourished.  HENT:  Head: Normocephalic and atraumatic.  Right Ear: Tympanic membrane normal.  Left Ear: Tympanic membrane normal.  Mouth/Throat: Oropharynx is clear and moist. No oropharyngeal exudate.  Empty socket L bottom where tooth extracted. No erythema. Tender to palpation  Neck:  Mild adenopathy on L side  Cardiovascular: Normal rate and regular rhythm.  Exam reveals no gallop and no friction rub.   No murmur heard. Pulmonary/Chest: Effort normal and breath sounds normal. She has no wheezes.  Lymphadenopathy:    She has cervical adenopathy.       Assessment:     1. Adenopathy: Mild cervical adenopathy present on left. Likely related to recent tooth infection. No symptoms to suggest systemic infection.    Plan:     1. Adenopathy: Start 7  day course of amoxicillin to cover possible beginnings of infection. Advised pt she could also consult dentist.     Dante Gang served as scribe for Dr. Diona Browner 11/28/14 4:00 pm  Patient seen and examined. Med student acted as Education administrator only.  HPI/ROS/PE and assessment/plan created  By MD and scribed by med student.  Eliezer Lofts MD

## 2014-11-28 NOTE — Progress Notes (Signed)
Pre visit review using our clinic review tool, if applicable. No additional management support is needed unless otherwise documented below in the visit note. 

## 2014-11-28 NOTE — Patient Instructions (Signed)
Complete 7 days of antibiotics Call if not improving as expected  Consider seeing dentist

## 2014-12-05 ENCOUNTER — Other Ambulatory Visit (INDEPENDENT_AMBULATORY_CARE_PROVIDER_SITE_OTHER): Payer: BLUE CROSS/BLUE SHIELD

## 2014-12-05 DIAGNOSIS — Z1211 Encounter for screening for malignant neoplasm of colon: Secondary | ICD-10-CM

## 2014-12-05 LAB — FECAL OCCULT BLOOD, IMMUNOCHEMICAL: FECAL OCCULT BLD: NEGATIVE

## 2014-12-08 ENCOUNTER — Encounter: Payer: Self-pay | Admitting: *Deleted

## 2015-04-06 ENCOUNTER — Other Ambulatory Visit: Payer: Self-pay | Admitting: Family Medicine

## 2015-04-06 NOTE — Telephone Encounter (Signed)
Please call and schedule CPE with fasting labs prior for Dr. Bedsole.  

## 2015-04-20 ENCOUNTER — Other Ambulatory Visit: Payer: Self-pay | Admitting: Obstetrics and Gynecology

## 2015-04-20 DIAGNOSIS — Z1231 Encounter for screening mammogram for malignant neoplasm of breast: Secondary | ICD-10-CM

## 2015-05-07 ENCOUNTER — Ambulatory Visit
Admission: RE | Admit: 2015-05-07 | Discharge: 2015-05-07 | Disposition: A | Discharge status: Home / Self Care | Payer: BLUE CROSS/BLUE SHIELD | Source: Ambulatory Visit | Attending: Obstetrics and Gynecology | Admitting: Obstetrics and Gynecology

## 2015-05-07 DIAGNOSIS — Z1231 Encounter for screening mammogram for malignant neoplasm of breast: Secondary | ICD-10-CM | POA: Diagnosis not present

## 2015-05-07 DIAGNOSIS — R921 Mammographic calcification found on diagnostic imaging of breast: Secondary | ICD-10-CM | POA: Diagnosis not present

## 2015-05-12 ENCOUNTER — Other Ambulatory Visit: Payer: Self-pay | Admitting: Obstetrics and Gynecology

## 2015-05-12 DIAGNOSIS — R921 Mammographic calcification found on diagnostic imaging of breast: Secondary | ICD-10-CM

## 2015-05-13 ENCOUNTER — Ambulatory Visit
Admission: RE | Admit: 2015-05-13 | Discharge: 2015-05-13 | Disposition: A | Discharge status: Home / Self Care | Payer: BLUE CROSS/BLUE SHIELD | Source: Ambulatory Visit | Attending: Obstetrics and Gynecology | Admitting: Obstetrics and Gynecology

## 2015-05-13 DIAGNOSIS — R921 Mammographic calcification found on diagnostic imaging of breast: Secondary | ICD-10-CM | POA: Insufficient documentation

## 2015-05-21 LAB — HM PAP SMEAR: HM Pap smear: NEGATIVE

## 2015-07-08 ENCOUNTER — Other Ambulatory Visit: Payer: Self-pay | Admitting: Family Medicine

## 2015-11-07 ENCOUNTER — Other Ambulatory Visit: Payer: Self-pay | Admitting: Family Medicine

## 2015-11-07 MED ORDER — LOSARTAN POTASSIUM-HCTZ 50-12.5 MG PO TABS: 1.0000 | ORAL_TABLET | Freq: Every day | ORAL | Status: DC

## 2016-01-16 ENCOUNTER — Telehealth: Payer: Self-pay | Admitting: Family Medicine

## 2016-01-16 NOTE — Telephone Encounter (Signed)
Lm for pt to sch CPE on 7/28 at 10 if pt is still on wait list and wants appt, mn

## 2016-02-08 ENCOUNTER — Other Ambulatory Visit: Payer: Self-pay | Admitting: *Deleted

## 2016-02-08 NOTE — Telephone Encounter (Signed)
Last office visit 11/28/2014.  Patient has cancelled her last two appointment.  No future appointments scheduled.

## 2016-02-15 ENCOUNTER — Other Ambulatory Visit: Payer: BLUE CROSS/BLUE SHIELD

## 2016-02-19 ENCOUNTER — Encounter: Payer: BLUE CROSS/BLUE SHIELD | Admitting: Family Medicine

## 2016-08-24 ENCOUNTER — Other Ambulatory Visit: Payer: Self-pay | Admitting: Obstetrics and Gynecology

## 2016-10-25 ENCOUNTER — Other Ambulatory Visit: Payer: Self-pay | Admitting: Obstetrics and Gynecology

## 2016-10-25 DIAGNOSIS — Z1231 Encounter for screening mammogram for malignant neoplasm of breast: Secondary | ICD-10-CM

## 2016-11-16 ENCOUNTER — Ambulatory Visit
Admission: RE | Admit: 2016-11-16 | Discharge: 2016-11-16 | Disposition: A | Discharge status: Home / Self Care | Payer: BLUE CROSS/BLUE SHIELD | Source: Ambulatory Visit | Attending: Obstetrics and Gynecology | Admitting: Obstetrics and Gynecology

## 2016-11-16 DIAGNOSIS — Z1231 Encounter for screening mammogram for malignant neoplasm of breast: Secondary | ICD-10-CM | POA: Insufficient documentation

## 2017-04-26 LAB — HM COLONOSCOPY

## 2017-05-05 ENCOUNTER — Encounter: Payer: BLUE CROSS/BLUE SHIELD | Admitting: Family Medicine

## 2017-05-09 ENCOUNTER — Encounter: Payer: Self-pay | Admitting: Family Medicine

## 2017-09-21 ENCOUNTER — Encounter: Payer: Self-pay | Admitting: Family Medicine

## 2017-09-21 ENCOUNTER — Ambulatory Visit: Payer: BC Managed Care – PPO | Admitting: Family Medicine

## 2017-09-21 DIAGNOSIS — J0100 Acute maxillary sinusitis, unspecified: Secondary | ICD-10-CM | POA: Diagnosis not present

## 2017-09-21 MED ORDER — AMOXICILLIN-POT CLAVULANATE 875-125 MG PO TABS: 1.0000 | ORAL_TABLET | Freq: Two times a day (BID) | ORAL | 0 refills | Status: DC

## 2017-09-21 NOTE — Progress Notes (Signed)
duration of symptoms: started with a cold on 08/29/27.  Lasted about 1 week and then started getting worse with more sinus pressure.  Now with persistent L sided sx in the face, worse in the AM.  Voice is still off.   Rhinorrhea: no Congestion: yes ear pain: no sore throat: not in the last week Cough: no  Myalgias: neck is sore but not stiff, posterior muscle ache fevers: no, tmax ~99.   Tried Afrin with transient relief.   Tried mucinex.   No vomiting. No diarrhea. No wheeze.  Taking ibuprofen for the facial pain.    Per HPI unless specifically indicated in ROS section   Meds, vitals, and allergies reviewed.   GEN: nad, alert and oriented HEENT: mucous membranes moist, TM w/o erythema, nasal epithelium injected, OP with cobblestoning NECK: supple w/o LA CV: rrr. PULM: ctab, no inc wob ABD: soft, +bs EXT: no edema L max sinus ttp

## 2017-09-21 NOTE — Patient Instructions (Addendum)
Ibuprofen with food and plenty of fluids.  Try to  taper ibuprofen when possible.  Start augmentin.  Try either nasal saline for flonase.  Take care.  Glad to see you.  Update [place] as needed.

## 2017-09-24 DIAGNOSIS — J0100 Acute maxillary sinusitis, unspecified: Secondary | ICD-10-CM | POA: Insufficient documentation

## 2017-09-24 NOTE — Assessment & Plan Note (Signed)
Ibuprofen with food and plenty of fluids.  Try to  taper ibuprofen when possible.  Start augmentin.  Try either nasal saline for flonase.  Nontoxic.  Okay for outpatient follow-up.  She agrees plan.

## 2017-10-05 ENCOUNTER — Other Ambulatory Visit (INDEPENDENT_AMBULATORY_CARE_PROVIDER_SITE_OTHER): Payer: BC Managed Care – PPO

## 2017-10-05 ENCOUNTER — Telehealth: Payer: Self-pay | Admitting: Family Medicine

## 2017-10-05 DIAGNOSIS — I10 Essential (primary) hypertension: Secondary | ICD-10-CM

## 2017-10-05 DIAGNOSIS — R7303 Prediabetes: Secondary | ICD-10-CM | POA: Diagnosis not present

## 2017-10-05 LAB — HEMOGLOBIN A1C: Hgb A1c MFr Bld: 6.6 % — ABNORMAL HIGH (ref 4.6–6.5)

## 2017-10-05 NOTE — Telephone Encounter (Signed)
-----   Message from Ellamae Sia sent at 09/27/2017 11:11 AM EDT ----- Regarding: Lab orders for Thursday, 3.21.19 Patient is scheduled for CPX labs, please order future labs, Thanks , Karna Christmas

## 2017-10-06 LAB — COMPREHENSIVE METABOLIC PANEL
ALK PHOS: 60 U/L (ref 39–117)
ALT: 11 U/L (ref 0–35)
AST: 12 U/L (ref 0–37)
Albumin: 4.2 g/dL (ref 3.5–5.2)
BILIRUBIN TOTAL: 0.3 mg/dL (ref 0.2–1.2)
BUN: 12 mg/dL (ref 6–23)
CO2: 25 mEq/L (ref 19–32)
Calcium: 9.2 mg/dL (ref 8.4–10.5)
Chloride: 106 mEq/L (ref 96–112)
Creatinine, Ser: 0.82 mg/dL (ref 0.40–1.20)
GFR: 77.02 mL/min (ref >60.00)
GLUCOSE: 107 mg/dL — AB (ref 70–99)
POTASSIUM: 4.8 meq/L (ref 3.5–5.1)
Sodium: 140 mEq/L (ref 135–145)
TOTAL PROTEIN: 6.6 g/dL (ref 6.0–8.3)

## 2017-10-06 LAB — LIPID PANEL
Cholesterol: 154 mg/dL (ref 0–200)
HDL: 48.1 mg/dL (ref >39.00)
LDL Cholesterol: 86 mg/dL (ref 0–99)
NonHDL: 106.06
Total CHOL/HDL Ratio: 3
Triglycerides: 101 mg/dL (ref 0.0–149.0)
VLDL: 20.2 mg/dL (ref 0.0–40.0)

## 2017-10-10 ENCOUNTER — Encounter: Payer: Self-pay | Admitting: Family Medicine

## 2017-10-10 ENCOUNTER — Other Ambulatory Visit: Payer: Self-pay

## 2017-10-10 ENCOUNTER — Ambulatory Visit (INDEPENDENT_AMBULATORY_CARE_PROVIDER_SITE_OTHER): Payer: BC Managed Care – PPO | Admitting: Family Medicine

## 2017-10-10 VITALS — BP 130/74 | HR 81 | Temp 98.8°F | Ht 63.5 in | Wt 146.5 lb

## 2017-10-10 DIAGNOSIS — R7303 Prediabetes: Secondary | ICD-10-CM

## 2017-10-10 DIAGNOSIS — Z0000 Encounter for general adult medical examination without abnormal findings: Secondary | ICD-10-CM | POA: Diagnosis not present

## 2017-10-10 DIAGNOSIS — Z1159 Encounter for screening for other viral diseases: Secondary | ICD-10-CM

## 2017-10-10 NOTE — Progress Notes (Signed)
Subjective:    Patient ID: Karla Boyer, female    DOB: 12-25-1960, 57 y.o.   MRN: [id number]  HPI  The patient is here for annual wellness exam and preventative care.    HTN HX: BP has been well controlled OFF losartan  BP Readings from Last 3 Encounters:  10/10/17 130/74  09/21/17 132/84  11/28/14 131/86     In last 6-8 months.. Daily GERD. Worst at night.  Occ sharp pain in epigastrium.  She has been using a lot of ibuprofen in last 6-8 months for tooth issues,  headache etc. She has stopped  Ibuprofen in last 16 days... No further abdominal pain.    She treats with tums off and on.  Diet: no fried foods, no carbs. Exercise: walking daily, bicycle off and on.   A!C elevated today. Body mass index is 25.54 kg/m.   Social History /Family History/Past Medical History reviewed in detail and updated in EMR if needed. Blood pressure 130/74, pulse 81, temperature 98.8 F (37.1 C), temperature source Oral, height 5' 3.5" (1.613 m), weight 146 lb 8 oz (66.5 kg), last menstrual period 09/11/2017.  Review of Systems  Constitutional: Negative for fatigue and fever.  HENT: Negative for congestion.   Eyes: Negative for pain.  Respiratory: Negative for cough and shortness of breath.   Cardiovascular: Negative for chest pain, palpitations and leg swelling.  Gastrointestinal: Positive for abdominal pain.  Genitourinary: Negative for dysuria and vaginal bleeding.  Musculoskeletal: Negative for back pain.  Neurological: Negative for syncope, light-headedness and headaches.  Psychiatric/Behavioral: Negative for dysphoric mood.       Objective:   Physical Exam  Constitutional: Vital signs are normal. She appears well-developed and well-nourished. She is cooperative.  Non-toxic appearance. She does not appear ill. No distress.  HENT:  Head: Normocephalic.  Right Ear: Hearing, tympanic membrane, external ear and ear canal normal.  Left Ear: Hearing, tympanic membrane, external  ear and ear canal normal.  Nose: Nose normal.  Eyes: Pupils are equal, round, and reactive to light. Conjunctivae, EOM and lids are normal. Lids are everted and swept, no foreign bodies found.  Neck: Trachea normal and normal range of motion. Neck supple. Carotid bruit is not present. No thyroid mass and no thyromegaly present.  Cardiovascular: Normal rate, regular rhythm, S1 normal, S2 normal, normal heart sounds and intact distal pulses. Exam reveals no gallop.  No murmur heard. Pulmonary/Chest: Effort normal and breath sounds normal. No respiratory distress. She has no wheezes. She has no rhonchi. She has no rales.  Abdominal: Soft. Normal appearance and bowel sounds are normal. She exhibits no distension, no fluid wave, no abdominal bruit and no mass. There is no hepatosplenomegaly. There is no tenderness. There is no rebound, no guarding and no CVA tenderness. No hernia.  Lymphadenopathy:    She has no cervical adenopathy.    She has no axillary adenopathy.  Neurological: She is alert. She has normal strength. No cranial nerve deficit or sensory deficit.  Skin: Skin is warm, dry and intact. No rash noted.  Psychiatric: Her speech is normal and behavior is normal. Judgment normal. Her mood appears not anxious. Cognition and memory are normal. She does not exhibit a depressed mood.          Assessment & Plan:  The patient's preventative maintenance and recommended screening tests for an annual wellness exam were reviewed in full today. Brought up to date unless services declined.  Counselled on the importance of diet, exercise, and  its role in overall health and mortality. The patient's FH and SH was reviewed, including their home life, tobacco status, and drug and alcohol status.    Vaccines: uptodate Pap/DVE: 2017 last pap...GYN Dr. Leafy Ro Mammo:  11/2016 Bone Density: no indication Colon:  04/26/2017 Smoking Status: none ETOH/ drug use: none/none  Hep C:  due  HIV screen:    refused

## 2017-10-10 NOTE — Assessment & Plan Note (Signed)
Encouraged exercise, weight loss, healthy eating habits. Low carb.Re-eval with lab only in 3 months to determine if DM true dx .

## 2017-10-10 NOTE — Patient Instructions (Addendum)
Avoid acidic foods, caffeine, citris, tomato. Work on low carbohydrate diet.  Staying away ibuprofen.  Can try a trial of prilosec 2 tabs  Daily of 20 mg x 4-6 weeks.  Call of GERD or stomach pain is not improved.

## 2018-03-16 ENCOUNTER — Other Ambulatory Visit (INDEPENDENT_AMBULATORY_CARE_PROVIDER_SITE_OTHER): Payer: BC Managed Care – PPO

## 2018-03-16 DIAGNOSIS — R7303 Prediabetes: Secondary | ICD-10-CM | POA: Diagnosis not present

## 2018-03-16 DIAGNOSIS — Z1159 Encounter for screening for other viral diseases: Secondary | ICD-10-CM

## 2018-03-16 LAB — HEMOGLOBIN A1C: HEMOGLOBIN A1C: 6.5 % (ref 4.6–6.5)

## 2018-03-20 LAB — HEPATITIS C ANTIBODY
HEP C AB: NONREACTIVE
SIGNAL TO CUT-OFF: 0.02 (ref <1.00)

## 2018-03-21 ENCOUNTER — Telehealth: Payer: Self-pay | Admitting: *Deleted

## 2018-03-21 DIAGNOSIS — R7303 Prediabetes: Secondary | ICD-10-CM

## 2018-03-21 NOTE — Telephone Encounter (Signed)
Copied from [place] [phone]. Topic: Referral - Request >> Mar 21, 2018  8:07 AM Synthia Innocent wrote: Reason for CRM: Requesting referral to [facility] at [facility] for high A1C

## 2018-03-22 NOTE — Telephone Encounter (Signed)
I really don't think she need to see endo for prediabetes. I would instead recommend OV to discuss with me and referral to nutritionist. If she still wants endo referral let me know.  Lab Results  Component Value Date   HGBA1C 6.5 03/16/2018

## 2018-03-22 NOTE — Telephone Encounter (Signed)
Karla Boyer notified as instructed by telephone.  She really wants to see the endocrinologist.  Please order referral.

## 2018-03-26 ENCOUNTER — Telehealth: Payer: Self-pay | Admitting: Family Medicine

## 2018-03-26 NOTE — Telephone Encounter (Signed)
Patient requested to go to [facility]. Called [facility] and she doesn't meet their criteria to be seen. The Hemoglobin A1C needs to be over 7.0 to meet their criteria. I called the patient and told her. She wants to look into this further and will let you know if she needs anything more from you. Cancelling this referral.

## 2018-10-02 ENCOUNTER — Other Ambulatory Visit: Payer: Self-pay | Admitting: Obstetrics and Gynecology

## 2018-10-02 DIAGNOSIS — Z1231 Encounter for screening mammogram for malignant neoplasm of breast: Secondary | ICD-10-CM

## 2018-11-29 ENCOUNTER — Encounter: Payer: Self-pay | Admitting: Family Medicine

## 2018-11-29 ENCOUNTER — Telehealth: Payer: Self-pay

## 2018-11-29 ENCOUNTER — Ambulatory Visit (INDEPENDENT_AMBULATORY_CARE_PROVIDER_SITE_OTHER): Payer: BC Managed Care – PPO | Admitting: Family Medicine

## 2018-11-29 DIAGNOSIS — I152 Hypertension secondary to endocrine disorders: Secondary | ICD-10-CM | POA: Insufficient documentation

## 2018-11-29 DIAGNOSIS — I10 Essential (primary) hypertension: Secondary | ICD-10-CM

## 2018-11-29 DIAGNOSIS — E1159 Type 2 diabetes mellitus with other circulatory complications: Secondary | ICD-10-CM | POA: Insufficient documentation

## 2018-11-29 NOTE — Progress Notes (Signed)
VIRTUAL VISIT Due to national recommendations of social distancing due to [place] 19, a virtual visit is felt to be most appropriate for this patient at this time.   I connected with the patient on 11/29/18 at  4:20 PM EDT by virtual telehealth platform and verified that I am speaking with the correct person using two identifiers.   I discussed the limitations, risks, security and privacy concerns of performing an evaluation and management service by  virtual telehealth platform and the availability of in person appointments. I also discussed with the patient that there may be a patient responsible charge related to this service. The patient expressed understanding and agreed to proceed.  Patient location: Home Provider Location: [facility] Participants: Eliezer Lofts and [place]   Chief Complaint  Patient presents with  . Hypertension    History of Present Illness:  58 year old female presents with recent increase in blood pressure. Last CPX 09/2017 Hypertension: In past she was well controlled on losartan... she stopped this in 2017 as BP was very well controlled.  Now in last 5 days BP increase to 160/90s.  She has been having more headaches lately. No new neuro changes. She is more tired lately  She has good diet, low salt diet. No added stress in particular.  She may be going through perimenopause. Using medication without problems or lightheadedness: none Chest pain with exertion:none Edema:none Short of breath:none Average home BPs: BP Readings from Last 3 Encounters:  11/29/18 (!) 137/99  10/10/17 130/74  09/21/17 132/84    Other issues:  Wt Readings from Last 3 Encounters:  11/29/18 143 lb (64.9 kg)  10/10/17 146 lb 8 oz (66.5 kg)  09/21/17 148 lb (67.1 kg)     COVID 19 screen No recent travel or known exposure to [place] The patient denies respiratory symptoms of COVID 19 at this time.  The importance of social distancing was discussed today.    Review of Systems  Constitutional: Negative for chills and fever.  HENT: Negative for congestion and ear pain.   Eyes: Negative for pain and redness.  Respiratory: Negative for cough and shortness of breath.   Cardiovascular: Negative for chest pain, palpitations and leg swelling.  Gastrointestinal: Negative for abdominal pain, blood in stool, constipation, diarrhea, nausea and vomiting.  Genitourinary: Negative for dysuria.  Musculoskeletal: Negative for falls and myalgias.  Skin: Negative for rash.  Neurological: Positive for headaches. Negative for dizziness.  Psychiatric/Behavioral: Negative for depression. The patient is not nervous/anxious.       Past Medical History:  Diagnosis Date  . Other ganglion and cyst of synovium, tendon, and bursa(727.49)   . Unspecified tinnitus     reports that she has never smoked. She has never used smokeless tobacco. She reports that she does not drink alcohol or use drugs.  No current outpatient medications on file.   Observations/Objective: Blood pressure (!) 137/99, pulse 93, height 5\' 3"  (1.6 m), weight 143 lb (64.9 kg), last menstrual period 11/27/2018.  Physical Exam  Physical Exam Constitutional:      General: The patient is not in acute distress. Pulmonary:     Effort: Pulmonary effort is normal. No respiratory distress.  Neurological:     Mental Status: The patient is alert and oriented to person, place, and time.  Psychiatric:        Mood and Affect: Mood normal.        Behavior: Behavior normal.   Assessment and Plan HTN (hypertension)  Had  similar issue in past. Family history of HTN. Eval for secondary causes... may be due to perimenopause.  Plan restart losartan HCTZ with 2 week follow up labs if no clear issues on initial labs. Encouraged exercise, weight loss, healthy eating habits.   Orders Placed This Encounter  Procedures  . Hemoglobin A1c    Standing Status:   Future    Standing Expiration Date:   02/27/2019   . Lipid panel    Standing Status:   Future    Standing Expiration Date:   02/27/2019  . Comprehensive metabolic panel    Standing Status:   Future    Standing Expiration Date:   02/27/2019  . CBC with Differential/Platelet    Standing Status:   Future    Standing Expiration Date:   11/29/2019  . TSH    Standing Status:   Future    Standing Expiration Date:   11/29/2019     I discussed the assessment and treatment plan with the patient. The patient was provided an opportunity to ask questions and all were answered. The patient agreed with the plan and demonstrated an understanding of the instructions.   The patient was advised to call back or seek an in-person evaluation if the symptoms worsen or if the condition fails to improve as anticipated.     Eliezer Lofts, MD

## 2018-11-29 NOTE — Assessment & Plan Note (Addendum)
Had similar issue in past. Family history of HTN. Eval for secondary causes... may be due to perimenopause.  Plan restart losartan HCTZ with 2 week follow up labs if no clear issues on initial labs. Encouraged exercise, weight loss, healthy eating habits.

## 2018-11-29 NOTE — Telephone Encounter (Signed)
Pt left v/m requesting cb to schedule appt for elevated BP. Called pt to triage but unable to speak with pt and left v/m for pt to cb for appt and ED precautions.

## 2018-11-29 NOTE — Telephone Encounter (Signed)
Pt called back and please see appt notes for 11/29/18 at 4:20.

## 2018-11-29 NOTE — Patient Instructions (Addendum)
Karla Boyer will call you to set up curbside lab tommorow.  Once labs return.. we will plan start of losartan HCTZ 50/12.5 mg daily as long as no concerns seen.  Follow blood pressure at home.Karla Boyer goal < 140/90.Karla Boyer we will follow up with curbside labs to check kidney function of losartan and virtual visit in 2 weeks.

## 2018-11-30 ENCOUNTER — Other Ambulatory Visit: Payer: Self-pay | Admitting: Family Medicine

## 2018-11-30 ENCOUNTER — Telehealth: Payer: Self-pay | Admitting: Family Medicine

## 2018-11-30 ENCOUNTER — Other Ambulatory Visit (INDEPENDENT_AMBULATORY_CARE_PROVIDER_SITE_OTHER): Payer: BC Managed Care – PPO

## 2018-11-30 DIAGNOSIS — I10 Essential (primary) hypertension: Secondary | ICD-10-CM

## 2018-11-30 LAB — COMPREHENSIVE METABOLIC PANEL
ALT: 11 U/L (ref 0–35)
AST: 12 U/L (ref 0–37)
Albumin: 4.2 g/dL (ref 3.5–5.2)
Alkaline Phosphatase: 69 U/L (ref 39–117)
BUN: 9 mg/dL (ref 6–23)
CO2: 24 mEq/L (ref 19–32)
Calcium: 8.7 mg/dL (ref 8.4–10.5)
Chloride: 106 mEq/L (ref 96–112)
Creatinine, Ser: 0.8 mg/dL (ref 0.40–1.20)
GFR: 74.24 mL/min (ref >60.00)
Glucose, Bld: 93 mg/dL (ref 70–99)
Potassium: 4 mEq/L (ref 3.5–5.1)
Sodium: 140 mEq/L (ref 135–145)
Total Bilirubin: 0.4 mg/dL (ref 0.2–1.2)
Total Protein: 6.7 g/dL (ref 6.0–8.3)

## 2018-11-30 LAB — CBC WITH DIFFERENTIAL/PLATELET
Basophils Absolute: 0 10*3/uL (ref 0.0–0.1)
Basophils Relative: 0.4 % (ref 0.0–3.0)
Eosinophils Absolute: 0 10*3/uL (ref 0.0–0.7)
Eosinophils Relative: 0.9 % (ref 0.0–5.0)
HCT: 34.3 % — ABNORMAL LOW (ref 36.0–46.0)
Hemoglobin: 10.7 g/dL — ABNORMAL LOW (ref 12.0–15.0)
Lymphocytes Relative: 28.1 % (ref 12.0–46.0)
Lymphs Abs: 1.6 10*3/uL (ref 0.7–4.0)
MCHC: 31 g/dL (ref 30.0–36.0)
MCV: 60.9 fl — ABNORMAL LOW (ref 78.0–100.0)
Monocytes Absolute: 0.5 10*3/uL (ref 0.1–1.0)
Monocytes Relative: 8.2 % (ref 3.0–12.0)
Neutro Abs: 3.5 10*3/uL (ref 1.4–7.7)
Neutrophils Relative %: 62.4 % (ref 43.0–77.0)
Platelets: 324 10*3/uL (ref 150.0–400.0)
RBC: 5.64 Mil/uL — ABNORMAL HIGH (ref 3.87–5.11)
RDW: 21.5 % — ABNORMAL HIGH (ref 11.5–15.5)
WBC: 5.6 10*3/uL (ref 4.0–10.5)

## 2018-11-30 LAB — LIPID PANEL
Cholesterol: 173 mg/dL (ref 0–200)
HDL: 47.8 mg/dL (ref >39.00)
LDL Cholesterol: 100 mg/dL — ABNORMAL HIGH (ref 0–99)
NonHDL: 124.92
Total CHOL/HDL Ratio: 4
Triglycerides: 126 mg/dL (ref 0.0–149.0)
VLDL: 25.2 mg/dL (ref 0.0–40.0)

## 2018-11-30 LAB — TSH: TSH: 1.35 u[IU]/mL (ref 0.35–4.50)

## 2018-11-30 LAB — HEMOGLOBIN A1C: Hgb A1c MFr Bld: 6.5 % (ref 4.6–6.5)

## 2018-11-30 MED ORDER — LOSARTAN POTASSIUM-HCTZ 50-12.5 MG PO TABS: 1.0000 | ORAL_TABLET | Freq: Every day | ORAL | 3 refills | Status: DC

## 2018-11-30 NOTE — Progress Notes (Signed)
Left message asking pt to call office  °

## 2018-11-30 NOTE — Progress Notes (Signed)
Labs 5/5

## 2018-11-30 NOTE — Progress Notes (Signed)
Labs 5/29 Virtual appointment 6/2

## 2018-12-11 ENCOUNTER — Telehealth: Payer: Self-pay | Admitting: Family Medicine

## 2018-12-11 NOTE — Telephone Encounter (Signed)
You are right.. I missed that.Karla Boyer! It is 2 weeks after BP med start so she needs a BMET. If you could put the reason for the next appt/virtual it is helpful me in situations like this. I usually try to given a reason when follow ups are scheduled. Thanks!

## 2018-12-11 NOTE — Telephone Encounter (Signed)
-----   Message from Cloyd Stagers, RT sent at 12/11/2018  2:19 PM EDT ----- Regarding: RE: Lab Orders for Friday 5.29.2020 Just want to double check before I call to cancel the appt.  The appt note says it is a 2 week follow up and if she had labs that would be a 2 week period...   Thank you, Dyke Maes RT(R)  ----- Message ----- From: Jinny Sanders, MD Sent: 12/11/2018  11:01 AM EDT To: Cloyd Stagers, RT Subject: RE: Lab Orders for Friday 5.29.2020            Already had labs on 11/30/2018. No labs needed! Thanks! ----- Message ----- From: Cloyd Stagers, RT Sent: 12/05/2018   1:43 PM EDT To: Jinny Sanders, MD Subject: Lab Orders for Friday 5.29.2020                Please place lab orders for Friday 5.29.2020, [facility] visit on Tuesday 6.2.2020 Thank you, Dyke Maes RT(R)

## 2018-12-11 NOTE — Telephone Encounter (Signed)
-----   Message from Cloyd Stagers, RT sent at 12/05/2018  1:43 PM EDT ----- Regarding: Lab Orders for Friday 5.29.2020 Please place lab orders for Friday 5.29.2020, [facility] visit on Tuesday 6.2.2020 Thank you, Dyke Maes RT(R)

## 2018-12-14 ENCOUNTER — Other Ambulatory Visit (INDEPENDENT_AMBULATORY_CARE_PROVIDER_SITE_OTHER): Payer: BC Managed Care – PPO

## 2018-12-14 ENCOUNTER — Other Ambulatory Visit: Payer: Self-pay

## 2018-12-14 DIAGNOSIS — I10 Essential (primary) hypertension: Secondary | ICD-10-CM

## 2018-12-14 LAB — BASIC METABOLIC PANEL
BUN: 11 mg/dL (ref 6–23)
CO2: 26 mEq/L (ref 19–32)
Calcium: 9.4 mg/dL (ref 8.4–10.5)
Chloride: 104 mEq/L (ref 96–112)
Creatinine, Ser: 0.88 mg/dL (ref 0.40–1.20)
GFR: 66.5 mL/min (ref >60.00)
Glucose, Bld: 93 mg/dL (ref 70–99)
Potassium: 4.2 mEq/L (ref 3.5–5.1)
Sodium: 138 mEq/L (ref 135–145)

## 2018-12-18 ENCOUNTER — Encounter: Payer: Self-pay | Admitting: Family Medicine

## 2018-12-18 ENCOUNTER — Ambulatory Visit (INDEPENDENT_AMBULATORY_CARE_PROVIDER_SITE_OTHER): Payer: BC Managed Care – PPO | Admitting: Family Medicine

## 2018-12-18 VITALS — BP 130/78 | HR 84 | Temp 98.3°F | Ht 63.0 in | Wt 147.8 lb

## 2018-12-18 DIAGNOSIS — I10 Essential (primary) hypertension: Secondary | ICD-10-CM | POA: Diagnosis not present

## 2018-12-18 DIAGNOSIS — R0609 Other forms of dyspnea: Secondary | ICD-10-CM | POA: Diagnosis not present

## 2018-12-18 DIAGNOSIS — E119 Type 2 diabetes mellitus without complications: Secondary | ICD-10-CM | POA: Insufficient documentation

## 2018-12-18 DIAGNOSIS — D509 Iron deficiency anemia, unspecified: Secondary | ICD-10-CM | POA: Diagnosis not present

## 2018-12-18 DIAGNOSIS — E1159 Type 2 diabetes mellitus with other circulatory complications: Secondary | ICD-10-CM | POA: Insufficient documentation

## 2018-12-18 DIAGNOSIS — Z862 Personal history of diseases of the blood and blood-forming organs and certain disorders involving the immune mechanism: Secondary | ICD-10-CM | POA: Insufficient documentation

## 2018-12-18 NOTE — Assessment & Plan Note (Signed)
Eval with EKG. Likely due to anemia. Thyroid normal.

## 2018-12-18 NOTE — Progress Notes (Signed)
Chief Complaint  Patient presents with  . Follow-up    HTN    History of Present Illness: HPI   58 year old female presents for follow up HTN. Hypertension:    Neg lab eval. Improved control back on losartan HCTZ... stable GFR/CR  After starting ARB No SE. Using medication without problems or lightheadedness: none Chest pain with exertion: chest tightness with exerting her self. Edema:none Short of breath: she feels that she is more tired with walking or exertion... not better with BP being improved. Since 10/2018  Has been feeling more tired lately. Average home BPs: 120/85 Other issues: No cough, no congestion sneeze  Recent labs showed she was somewhat anemia... she is having  intermittant perimenopausal periods. No heavy bleeding, no no bloo din urine or stool. Thyroid nml.  Prediabetes: .. now borderline diabetes.  Sessile polys and hemorrhoid 05/2017 Dr. Gustavo Lah.Marland Kitchen recommended repeat 3 -5 years.  COVID 19 screen No recent travel or known exposure to COVID19 The patient denies respiratory symptoms of COVID 19 at this time.  The importance of social distancing was discussed today.   Review of Systems  Constitutional: Positive for malaise/fatigue. Negative for chills and fever.  HENT: Negative for congestion and ear pain.   Eyes: Negative for pain and redness.  Respiratory: Positive for shortness of breath. Negative for cough, hemoptysis, sputum production and wheezing.   Cardiovascular: Negative for chest pain, palpitations and leg swelling.  Gastrointestinal: Negative for abdominal pain, blood in stool, constipation, diarrhea, nausea and vomiting.  Genitourinary: Negative for dysuria.  Musculoskeletal: Negative for falls and myalgias.  Skin: Negative for rash.  Neurological: Negative for dizziness.  Psychiatric/Behavioral: Negative for depression. The patient is not nervous/anxious.       Past Medical History:  Diagnosis Date  . Other ganglion and cyst of  synovium, tendon, and bursa(727.49)   . Unspecified tinnitus     reports that she has never smoked. She has never used smokeless tobacco. She reports that she does not drink alcohol or use drugs.   Current Outpatient Medications:  .  losartan-hydrochlorothiazide (HYZAAR) 50-12.5 MG tablet, Take 1 tablet by mouth daily., Disp: 90 tablet, Rfl: 3   Observations/Objective: Blood pressure 130/78, pulse 84, temperature 98.3 F (36.8 C), temperature source Oral, height 5\' 3"  (1.6 m), weight 147 lb 12 oz (67 kg), last menstrual period 09/12/2018.  Physical Exam Constitutional:      General: She is not in acute distress.    Appearance: Normal appearance. She is well-developed. She is not ill-appearing or toxic-appearing.  HENT:     Head: Normocephalic.     Right Ear: Hearing, tympanic membrane, ear canal and external ear normal. Tympanic membrane is not erythematous, retracted or bulging.     Left Ear: Hearing, tympanic membrane, ear canal and external ear normal. Tympanic membrane is not erythematous, retracted or bulging.     Nose: No mucosal edema or rhinorrhea.     Right Sinus: No maxillary sinus tenderness or frontal sinus tenderness.     Left Sinus: No maxillary sinus tenderness or frontal sinus tenderness.     Mouth/Throat:     Pharynx: Uvula midline.  Eyes:     General: Lids are normal. Lids are everted, no foreign bodies appreciated.     Conjunctiva/sclera: Conjunctivae normal.     Pupils: Pupils are equal, round, and reactive to light.  Neck:     Musculoskeletal: Normal range of motion and neck supple.     Thyroid: No thyroid mass or thyromegaly.  Vascular: No carotid bruit.     Trachea: Trachea normal.  Cardiovascular:     Rate and Rhythm: Normal rate and regular rhythm.     Pulses: Normal pulses.     Heart sounds: Normal heart sounds, S1 normal and S2 normal. No murmur. No friction rub. No gallop.   Pulmonary:     Effort: Pulmonary effort is normal. No tachypnea or  respiratory distress.     Breath sounds: Normal breath sounds. No decreased breath sounds, wheezing, rhonchi or rales.  Abdominal:     General: Bowel sounds are normal.     Palpations: Abdomen is soft.     Tenderness: There is no abdominal tenderness.  Skin:    General: Skin is warm and dry.     Findings: No rash.  Neurological:     Mental Status: She is alert.  Psychiatric:        Mood and Affect: Mood is not anxious or depressed.        Speech: Speech normal.        Behavior: Behavior normal. Behavior is cooperative.        Thought Content: Thought content normal.        Judgment: Judgment normal.      Assessment and Plan HTN (hypertension) Improved control on losartan HCTZ  Controlled type 2 diabetes mellitus without complication, without long-term current use of insulin (HCC) Info on low carb diet, exercise lifestyle changes, DM complications and follow up given and reviewed.  Microcytic anemia Likely causing DOE.Marland Kitchen likely iron def.. eval with ;labs.  Unclear cause.. hx of sessile polyps. Check hemeoccult stool.  DOE (dyspnea on exertion) Eval with EKG. Likely due to anemia. Thyroid normal.       Eliezer Lofts, MD

## 2018-12-18 NOTE — Assessment & Plan Note (Signed)
Likely causing DOE.Marland Kitchen likely iron def.. eval with ;labs.  Unclear cause.. hx of sessile polyps. Check hemeoccult stool.

## 2018-12-18 NOTE — Assessment & Plan Note (Signed)
Info on low carb diet, exercise lifestyle changes, DM complications and follow up given and reviewed.

## 2018-12-18 NOTE — Assessment & Plan Note (Signed)
Improved control on losartan HCTZ

## 2018-12-18 NOTE — Patient Instructions (Addendum)
Please stop at the lab to have labs drawn. Work on low carbohydrate diet, increase exercise as tolerated.

## 2018-12-19 LAB — IBC + FERRITIN
Ferritin: 8.1 ng/mL — ABNORMAL LOW (ref 10.0–291.0)
Iron: 27 ug/dL — ABNORMAL LOW (ref 42–145)
Saturation Ratios: 4.9 % — ABNORMAL LOW (ref 20.0–50.0)
Transferrin: 390 mg/dL — ABNORMAL HIGH (ref 212.0–360.0)

## 2019-03-11 NOTE — H&P (Signed)
Karla Boyer is a 58 y.o. female here for Pre Op Consulting  History of Present Illness: -Patient presented at annual in 09/2018 and I felt left adnexal fullness upon pelvic exam. -Irregular heavy vaginal bleeding with thickened endometrial stripe on TVUS (see below) -Patient has decided to proceed with a hysterectomy  to treat her menometrorrhagia.   Pertinent hx:  Body mass index is 27.25 kg/m. -Novasure ablation 2008 -Irregular periods; Feb 26 she had a period, did not return until June 6 (see above) -09/2018 neg -Sexually active, vasectomy -Recent Dx of significant Iron-deficiency anemia since   Work Up: -TVUS: Uterus=11.49 x 7.72 x 10.06cm Uterus anteverted Fibroids seen: 1)mid=3.4cm   2)Lt lateral=7.2cm Endometrium=36mm No free fluid seen Rt ovary appears wnl Lt ovary volume=21.39ml -EMBx: Benign. No hyperplasia or carcinoma. -Labs: CA125 neg, CBC wnl, Iron panel wnl, FSH 13, E2 56.8 - confirm premenopausal for that month -Patient has decided to elect for a hysterectomy  Surgical Hx: -Endometrial ablation in 2008  Past Medical History:  has a past medical history of Hypertension.  Past Surgical History:  has a past surgical history that includes Endometrial ablation (07/21/2006) and Colonoscopy (04/26/2017). Family History: family history includes Heart disease in her maternal grandfather, maternal grandmother, and mother; High blood pressure (Hypertension) in her maternal grandfather, maternal grandmother, and mother. Social History:  reports that she has never smoked. She has never used smokeless tobacco. She reports current alcohol use. She reports that she does not use drugs. OB/GYN History:  OB History    Gravida  3   Para  3   Term  3   Preterm      AB      Living  3     SAB      TAB      Ectopic      Molar      Multiple      Live Births           Allergies: has No Known Allergies. Medications: Current Outpatient Medications:  .   cholecalciferol (VITAMIN D3) 1,000 unit capsule, Take 1,000 Units by mouth once daily., Disp: , Rfl:  .  losartan-hydrochlorothiazide (HYZAAR) 50-12.5 mg tablet, Take 1 tablet by mouth once daily, Disp: , Rfl:  .  multivitamin capsule, Take 1 capsule by mouth once daily., Disp: , Rfl:  .  peg-electrolyte (NULYTELY) solution, Take 4,000 mLs by mouth as directed. (Patient not taking: Reported on 10/02/2018  ), Disp: 4000 mL, Rfl: 0  Review of Systems: No SOB, no palpitations or chest pain, no new lower extremity edema, no nausea or vomiting or bowel or bladder complaints. See HPI for gyn specific ROS.   Exam:   BP 130/84   Pulse 77   Wt 67.6 kg (149 lb)   BMI 27.25 kg/m   Constitutional:  General appearance: Well nourished, well developed female in no acute distress.  CV: RRR Pulm: CTAB Neuro/psych:  Normal mood and affect. No gross motor deficits. Neck:  Supple, normal appearance.  Respiratory:  Normal respiratory effort, no use of accessory muscles Skin:  No visible rashes or external lesions  Impression:   The encounter diagnosis was Pre-op evaluation.  Plan:   1. Pre-Op, Sign Consents -Patient returns for a preoperative discussion regarding her plans to proceed with surgical treatment of her abnormal uterine bleeding and uterine fibroids by total laparoscopic hysterectomy with bilateral salpingectomy with hand morcellation procedure.  We will perform a cystoscopy to evaluate the urinary tract after the procedure.   -  The patient and I discussed the technical aspects of the procedure including the potential for risks and complications.  These include but are not limited to the risk of infection requiring post-operative antibiotics or further procedures.  We talked about the risk of injury to adjacent organs including bladder, bowel, ureter, blood vessels or nerves.  We talked about the need to convert to an open incision.  We talked about the possible need for blood transfusion.  We  talked about postop complications such as thromboembolic or cardiopulmonary complications.  All of her questions were answered.  Her preoperative exam was completed and the appropriate consents were signed. She is scheduled to undergo this procedure in the near future.  Specific Peri-operative Considerations:  - Consent: obtained today - Health Maintenance: n/a - Labs: CBC, CMP preoperatively - Studies: EKG, CXR preoperatively - Bowel Preparation: None required - Abx:  Ancef 2g - VTE ppx: SCDs perioperatively - Glucose Protocol: none - Beta-blockade: none  Diagnoses and all orders for this visit:  Pre-op evaluation

## 2019-03-14 ENCOUNTER — Other Ambulatory Visit: Payer: BC Managed Care – PPO

## 2019-03-19 ENCOUNTER — Other Ambulatory Visit: Payer: Self-pay

## 2019-03-19 ENCOUNTER — Encounter
Admission: RE | Admit: 2019-03-19 | Discharge: 2019-03-19 | Disposition: A | Discharge status: Home / Self Care | Payer: BC Managed Care – PPO | Source: Ambulatory Visit | Attending: Obstetrics and Gynecology | Admitting: Obstetrics and Gynecology

## 2019-03-19 DIAGNOSIS — Z20828 Contact with and (suspected) exposure to other viral communicable diseases: Secondary | ICD-10-CM | POA: Diagnosis not present

## 2019-03-19 DIAGNOSIS — Z01812 Encounter for preprocedural laboratory examination: Secondary | ICD-10-CM | POA: Diagnosis not present

## 2019-03-19 DIAGNOSIS — N939 Abnormal uterine and vaginal bleeding, unspecified: Secondary | ICD-10-CM | POA: Diagnosis not present

## 2019-03-19 HISTORY — DX: Unspecified osteoarthritis, unspecified site: M19.90

## 2019-03-19 HISTORY — DX: Anemia, unspecified: D64.9

## 2019-03-19 HISTORY — DX: Other specified postprocedural states: R11.2

## 2019-03-19 HISTORY — DX: Essential (primary) hypertension: I10

## 2019-03-19 HISTORY — DX: Other complications of anesthesia, initial encounter: T88.59XA

## 2019-03-19 HISTORY — DX: Other specified postprocedural states: Z98.890

## 2019-03-19 HISTORY — DX: Gastro-esophageal reflux disease without esophagitis: K21.9

## 2019-03-19 LAB — CBC
HCT: 46.1 % — ABNORMAL HIGH (ref 36.0–46.0)
Hemoglobin: 15 g/dL (ref 12.0–15.0)
MCH: 26.3 pg (ref 26.0–34.0)
MCHC: 32.5 g/dL (ref 30.0–36.0)
MCV: 80.9 fL (ref 80.0–100.0)
Platelets: 269 10*3/uL (ref 150–400)
RBC: 5.7 MIL/uL — ABNORMAL HIGH (ref 3.87–5.11)
RDW: 18.1 % — ABNORMAL HIGH (ref 11.5–15.5)
WBC: 6.9 10*3/uL (ref 4.0–10.5)
nRBC: 0 % (ref 0.0–0.2)

## 2019-03-19 LAB — BASIC METABOLIC PANEL
Anion gap: 8 (ref 5–15)
BUN: 13 mg/dL (ref 6–20)
CO2: 27 mmol/L (ref 22–32)
Calcium: 9.7 mg/dL (ref 8.9–10.3)
Chloride: 105 mmol/L (ref 98–111)
Creatinine, Ser: 0.76 mg/dL (ref 0.44–1.00)
GFR calc Af Amer: >60 mL/min (ref >60)
GFR calc non Af Amer: >60 mL/min (ref >60)
Glucose, Bld: 103 mg/dL — ABNORMAL HIGH (ref 70–99)
Potassium: 4.5 mmol/L (ref 3.5–5.1)
Sodium: 140 mmol/L (ref 135–145)

## 2019-03-19 LAB — TYPE AND SCREEN
ABO/RH(D): O POS
Antibody Screen: NEGATIVE

## 2019-03-19 LAB — SARS CORONAVIRUS 2 (TAT 6-24 HRS): SARS Coronavirus 2: NEGATIVE

## 2019-03-19 NOTE — Patient Instructions (Signed)
Your procedure is scheduled on: 03-22-19 FRIDAY Report to Same Day Surgery 2nd floor medical mall [facility] Entrance-take elevator on left to 2nd floor.  Check in with surgery information desk.) To find out your arrival time please call [phone] between 1PM - 3PM on 03-21-19 THURSDAY  Remember: Instructions that are not followed completely may result in serious medical risk, up to and including death, or upon the discretion of your surgeon and anesthesiologist your surgery may need to be rescheduled.    _x___ 1. Do not eat food after midnight the night before your procedure. NO GUM OR CANDY AFTER MIDNIGHT. You may drink clear liquids up to 2 hours before you are scheduled to arrive at the hospital for your procedure.  Do not drink clear liquids within 2 hours of your scheduled arrival to the hospital.  Clear liquids include  --Water or Apple juice without pulp  -- Gatorade  --Black Coffee or Clear Tea (No milk, no creamers, do not add anything to the coffee or Tea)   ____Ensure clear carbohydrate drink on the way to the hospital for bariatric patients  _X___Ensure clear carbohydrate drink 3 hours before surgery.      __x__ 2. No Alcohol for 24 hours before or after surgery.   __x__3. No Smoking or e-cigarettes for 24 prior to surgery.  Do not use any chewable tobacco products for at least 6 hour prior to surgery   ____  4. Bring all medications with you on the day of surgery if instructed.    __x__ 5. Notify your doctor if there is any change in your medical condition     (cold, fever, infections).    x___6. On the morning of surgery brush your teeth with toothpaste and water.  You may rinse your mouth with mouth wash if you wish.  Do not swallow any toothpaste or mouthwash.   Do not wear jewelry, make-up, hairpins, clips or nail polish.  Do not wear lotions, powders, or perfumes. You may wear deodorant.  Do not shave 48 hours prior to surgery. Men may shave face and  neck.  Do not bring valuables to the hospital.    [facility] is not responsible for any belongings or valuables.               Contacts, dentures or bridgework may not be worn into surgery.  Leave your suitcase in the car. After surgery it may be brought to your room.  For patients admitted to the hospital, discharge time is determined by your treatment team.  _  Patients discharged the day of surgery will not be allowed to drive home.  You will need someone to drive you home and stay with you the night of your procedure.    Please read over the following fact sheets that you were given:   [facility] Preparing for Surgery   _x___ TAKE THE FOLLOWING MEDICATION THE MORNING OF SURGERY WITH A SMALL SIP OF WATER.  These include:  1. OMEPRAZOLE (PRILOSEC)  2. TAKE AN OMEPRAZOLE THE NIGHT BEFORE YOUR SURGERY  3.  4.  5.  6.  ____Fleets enema or Magnesium Citrate as directed.   _x___ Use CHG Soap or sage wipes as directed on instruction sheet   ____ Use inhalers on the day of surgery and bring to hospital day of surgery  ____ Stop Metformin and Janumet 2 days prior to surgery.    ____ Take 1/2 of usual insulin dose the night before surgery and none  on the morning surgery.   ____ Follow recommendations from Cardiologist, Pulmonologist or PCP regarding stopping Aspirin, Coumadin, Plavix ,Eliquis, Effient, or Pradaxa, and Pletal.  X____Stop Anti-inflammatories such as Advil, Aleve, Ibuprofen, Motrin, Naproxen, Naprosyn, Goodies powders or aspirin products NOW-OK to take Tylenol    ____ Stop supplements until after surgery.   ____ Bring C-Pap to the hospital.

## 2019-03-22 ENCOUNTER — Other Ambulatory Visit: Payer: Self-pay

## 2019-03-22 ENCOUNTER — Ambulatory Visit: Payer: BC Managed Care – PPO | Admitting: Anesthesiology

## 2019-03-22 ENCOUNTER — Encounter
Admission: RE | Disposition: A | Discharge status: Home / Self Care | Payer: Self-pay | Source: Home / Self Care | Attending: Obstetrics and Gynecology

## 2019-03-22 ENCOUNTER — Ambulatory Visit
Admission: RE | Admit: 2019-03-22 | Discharge: 2019-03-22 | Disposition: A | Discharge status: Home / Self Care | Payer: BC Managed Care – PPO | Attending: Obstetrics and Gynecology | Admitting: Obstetrics and Gynecology

## 2019-03-22 DIAGNOSIS — Z79899 Other long term (current) drug therapy: Secondary | ICD-10-CM | POA: Insufficient documentation

## 2019-03-22 DIAGNOSIS — I10 Essential (primary) hypertension: Secondary | ICD-10-CM | POA: Insufficient documentation

## 2019-03-22 DIAGNOSIS — Z8249 Family history of ischemic heart disease and other diseases of the circulatory system: Secondary | ICD-10-CM | POA: Insufficient documentation

## 2019-03-22 DIAGNOSIS — D259 Leiomyoma of uterus, unspecified: Secondary | ICD-10-CM | POA: Diagnosis present

## 2019-03-22 DIAGNOSIS — N736 Female pelvic peritoneal adhesions (postinfective): Secondary | ICD-10-CM | POA: Insufficient documentation

## 2019-03-22 DIAGNOSIS — N800 Endometriosis of uterus: Secondary | ICD-10-CM | POA: Diagnosis not present

## 2019-03-22 DIAGNOSIS — D251 Intramural leiomyoma of uterus: Secondary | ICD-10-CM | POA: Diagnosis not present

## 2019-03-22 DIAGNOSIS — N888 Other specified noninflammatory disorders of cervix uteri: Secondary | ICD-10-CM | POA: Diagnosis not present

## 2019-03-22 DIAGNOSIS — N950 Postmenopausal bleeding: Secondary | ICD-10-CM | POA: Insufficient documentation

## 2019-03-22 HISTORY — PX: LAPAROSCOPIC LYSIS OF ADHESIONS: SHX5905

## 2019-03-22 HISTORY — PX: CYSTOSCOPY: SHX5120

## 2019-03-22 HISTORY — PX: LAPAROSCOPIC BILATERAL SALPINGECTOMY: SHX5889

## 2019-03-22 HISTORY — PX: LAPAROSCOPIC HYSTERECTOMY: SHX1926

## 2019-03-22 LAB — POCT PREGNANCY, URINE: Preg Test, Ur: NEGATIVE

## 2019-03-22 LAB — ABO/RH: ABO/RH(D): O POS

## 2019-03-22 SURGERY — HYSTERECTOMY, TOTAL, LAPAROSCOPIC
Anesthesia: General

## 2019-03-22 MED ORDER — ACETAMINOPHEN 500 MG PO TABS: 1000.0000 mg | ORAL_TABLET | Freq: Four times a day (QID) | ORAL | 0 refills | Status: AC

## 2019-03-22 MED ORDER — FENTANYL CITRATE (PF) 100 MCG/2ML IJ SOLN
INTRAMUSCULAR | Status: DC | PRN
  Administered 2019-03-22 (×2): 50 ug via INTRAVENOUS

## 2019-03-22 MED ORDER — FENTANYL CITRATE (PF) 100 MCG/2ML IJ SOLN
INTRAMUSCULAR | Status: AC
  Filled 2019-03-22: qty 2

## 2019-03-22 MED ORDER — GABAPENTIN 800 MG PO TABS: 800.0000 mg | ORAL_TABLET | Freq: Every day | ORAL | 0 refills | Status: DC

## 2019-03-22 MED ORDER — PROPOFOL 10 MG/ML IV BOLUS
INTRAVENOUS | Status: AC
  Filled 2019-03-22: qty 20

## 2019-03-22 MED ORDER — MIDAZOLAM HCL 2 MG/2ML IJ SOLN
INTRAMUSCULAR | Status: DC | PRN
  Administered 2019-03-22: 2 mg via INTRAVENOUS

## 2019-03-22 MED ORDER — HYDROMORPHONE HCL 1 MG/ML IJ SOLN
INTRAMUSCULAR | Status: AC
  Filled 2019-03-22: qty 1

## 2019-03-22 MED ORDER — IBUPROFEN 800 MG PO TABS: 800.0000 mg | ORAL_TABLET | Freq: Three times a day (TID) | ORAL | 1 refills | Status: AC

## 2019-03-22 MED ORDER — DOCUSATE SODIUM 100 MG PO CAPS: 100.0000 mg | ORAL_CAPSULE | Freq: Two times a day (BID) | ORAL | 0 refills | Status: DC

## 2019-03-22 MED ORDER — BUPIVACAINE HCL 0.5 % IJ SOLN
INTRAMUSCULAR | Status: DC | PRN
  Administered 2019-03-22: 4 mL
  Administered 2019-03-22: 10 mL

## 2019-03-22 MED ORDER — METOCLOPRAMIDE HCL 10 MG PO TABS: 10.0000 mg | ORAL_TABLET | Freq: Three times a day (TID) | ORAL | 1 refills | Status: DC | PRN

## 2019-03-22 MED ORDER — ONDANSETRON HCL 4 MG/2ML IJ SOLN
INTRAMUSCULAR | Status: AC
  Filled 2019-03-22: qty 2

## 2019-03-22 MED ORDER — CEFAZOLIN SODIUM-DEXTROSE 2-4 GM/100ML-% IV SOLN
INTRAVENOUS | Status: AC
  Filled 2019-03-22: qty 100

## 2019-03-22 MED ORDER — ONDANSETRON HCL 4 MG/2ML IJ SOLN
4.0000 mg | Freq: Once | INTRAMUSCULAR | Status: AC | PRN
  Administered 2019-03-22: 18:00:00 4 mg via INTRAVENOUS

## 2019-03-22 MED ORDER — FENTANYL CITRATE (PF) 100 MCG/2ML IJ SOLN
25.0000 ug | INTRAMUSCULAR | Status: DC | PRN
  Administered 2019-03-22 (×4): 25 ug via INTRAVENOUS

## 2019-03-22 MED ORDER — PHENYLEPHRINE HCL (PRESSORS) 10 MG/ML IV SOLN
INTRAVENOUS | Status: DC | PRN
  Administered 2019-03-22 (×5): 100 ug via INTRAVENOUS

## 2019-03-22 MED ORDER — LACTATED RINGERS IV SOLN: INTRAVENOUS | Status: DC

## 2019-03-22 MED ORDER — FENTANYL CITRATE (PF) 100 MCG/2ML IJ SOLN
INTRAMUSCULAR | Status: AC
  Administered 2019-03-22: 25 ug via INTRAVENOUS
  Filled 2019-03-22: qty 2

## 2019-03-22 MED ORDER — PROMETHAZINE HCL 25 MG/ML IJ SOLN
6.2500 mg | Freq: Once | INTRAMUSCULAR | Status: AC
  Administered 2019-03-22: 18:00:00 6.25 mg via INTRAVENOUS

## 2019-03-22 MED ORDER — ACETAMINOPHEN 500 MG PO TABS
1000.0000 mg | ORAL_TABLET | ORAL | Status: AC
  Administered 2019-03-22: 09:00:00 1000 mg via ORAL

## 2019-03-22 MED ORDER — LIDOCAINE HCL (CARDIAC) PF 100 MG/5ML IV SOSY
PREFILLED_SYRINGE | INTRAVENOUS | Status: DC | PRN
  Administered 2019-03-22: 80 mg via INTRAVENOUS

## 2019-03-22 MED ORDER — PROPOFOL 10 MG/ML IV BOLUS
INTRAVENOUS | Status: DC | PRN
  Administered 2019-03-22: 150 mg via INTRAVENOUS

## 2019-03-22 MED ORDER — SODIUM CHLORIDE FLUSH 0.9 % IV SOLN
INTRAVENOUS | Status: AC
  Filled 2019-03-22: qty 9

## 2019-03-22 MED ORDER — DEXAMETHASONE SODIUM PHOSPHATE 10 MG/ML IJ SOLN
INTRAMUSCULAR | Status: DC | PRN
  Administered 2019-03-22: 10 mg via INTRAVENOUS

## 2019-03-22 MED ORDER — OXYCODONE HCL 5 MG PO CAPS: 5.0000 mg | ORAL_CAPSULE | Freq: Four times a day (QID) | ORAL | 0 refills | Status: DC | PRN

## 2019-03-22 MED ORDER — ONDANSETRON HCL 4 MG/2ML IJ SOLN
INTRAMUSCULAR | Status: AC
  Administered 2019-03-22: 4 mg via INTRAVENOUS
  Filled 2019-03-22: qty 2

## 2019-03-22 MED ORDER — CEFAZOLIN SODIUM-DEXTROSE 2-4 GM/100ML-% IV SOLN
2.0000 g | INTRAVENOUS | Status: AC
  Administered 2019-03-22 (×2): 2 g via INTRAVENOUS

## 2019-03-22 MED ORDER — SUGAMMADEX SODIUM 200 MG/2ML IV SOLN
INTRAVENOUS | Status: AC
  Filled 2019-03-22: qty 2

## 2019-03-22 MED ORDER — FENTANYL CITRATE (PF) 250 MCG/5ML IJ SOLN
INTRAMUSCULAR | Status: AC
  Filled 2019-03-22: qty 5

## 2019-03-22 MED ORDER — GABAPENTIN 300 MG PO CAPS
ORAL_CAPSULE | ORAL | Status: AC
  Administered 2019-03-22: 300 mg via ORAL
  Filled 2019-03-22: qty 1

## 2019-03-22 MED ORDER — ROCURONIUM BROMIDE 100 MG/10ML IV SOLN
INTRAVENOUS | Status: DC | PRN
  Administered 2019-03-22: 20 mg via INTRAVENOUS
  Administered 2019-03-22: 10 mg via INTRAVENOUS
  Administered 2019-03-22: 40 mg via INTRAVENOUS
  Administered 2019-03-22 (×2): 10 mg via INTRAVENOUS

## 2019-03-22 MED ORDER — MIDAZOLAM HCL 2 MG/2ML IJ SOLN
INTRAMUSCULAR | Status: AC
  Filled 2019-03-22: qty 2

## 2019-03-22 MED ORDER — HYDROMORPHONE HCL 1 MG/ML IJ SOLN
INTRAMUSCULAR | Status: DC | PRN
  Administered 2019-03-22: 0.5 mg via INTRAVENOUS

## 2019-03-22 MED ORDER — SUCCINYLCHOLINE CHLORIDE 20 MG/ML IJ SOLN
INTRAMUSCULAR | Status: AC
  Filled 2019-03-22: qty 1

## 2019-03-22 MED ORDER — OXYCODONE HCL 5 MG PO TABS
5.0000 mg | ORAL_TABLET | Freq: Once | ORAL | Status: AC
  Administered 2019-03-22: 17:00:00 5 mg via ORAL

## 2019-03-22 MED ORDER — ONDANSETRON HCL 4 MG/2ML IJ SOLN
INTRAMUSCULAR | Status: DC | PRN
  Administered 2019-03-22 (×2): 4 mg via INTRAVENOUS

## 2019-03-22 MED ORDER — ACETAMINOPHEN 500 MG PO TABS
ORAL_TABLET | ORAL | Status: AC
  Administered 2019-03-22: 1000 mg via ORAL
  Filled 2019-03-22: qty 2

## 2019-03-22 MED ORDER — PROMETHAZINE HCL 25 MG/ML IJ SOLN
INTRAMUSCULAR | Status: AC
  Administered 2019-03-22: 6.25 mg via INTRAVENOUS
  Filled 2019-03-22: qty 1

## 2019-03-22 MED ORDER — LACTATED RINGERS IV SOLN
INTRAVENOUS | Status: DC | PRN
  Administered 2019-03-22: 10:00:00 via INTRAVENOUS

## 2019-03-22 MED ORDER — OXYCODONE HCL 5 MG PO TABS
ORAL_TABLET | ORAL | Status: AC
  Filled 2019-03-22: qty 1

## 2019-03-22 MED ORDER — ROCURONIUM BROMIDE 50 MG/5ML IV SOLN
INTRAVENOUS | Status: AC
  Filled 2019-03-22: qty 1

## 2019-03-22 MED ORDER — BUPIVACAINE HCL (PF) 0.5 % IJ SOLN
INTRAMUSCULAR | Status: AC
  Filled 2019-03-22: qty 30

## 2019-03-22 MED ORDER — SUGAMMADEX SODIUM 200 MG/2ML IV SOLN
INTRAVENOUS | Status: DC | PRN
  Administered 2019-03-22: 300 mg via INTRAVENOUS

## 2019-03-22 MED ORDER — GABAPENTIN 300 MG PO CAPS
300.0000 mg | ORAL_CAPSULE | ORAL | Status: AC
  Administered 2019-03-22: 09:00:00 300 mg via ORAL

## 2019-03-22 SURGICAL SUPPLY — 77 items
ANCHOR TIS RET SYS 1550ML (BAG) ×2 IMPLANT
BAG URINE DRAINAGE (UROLOGICAL SUPPLIES) ×10 IMPLANT
BLADE SURG SZ11 CARB STEEL (BLADE) ×13 IMPLANT
CATH FOLEY 2WAY  5CC 16FR (CATHETERS) ×2
CATH URTH 16FR FL 2W BLN LF (CATHETERS) ×3 IMPLANT
CHLORAPREP W/TINT 26 (MISCELLANEOUS) ×5 IMPLANT
CLOSURE WOUND 1/4X4 (GAUZE/BANDAGES/DRESSINGS) ×1
CORD MONOPOLAR M/FML 12FT (MISCELLANEOUS) ×5 IMPLANT
COUNTER NEEDLE 20/40 LG (NEEDLE) ×5 IMPLANT
COVER LIGHT HANDLE STERIS (MISCELLANEOUS) ×10 IMPLANT
COVER WAND RF STERILE (DRAPES) ×5 IMPLANT
DERMABOND ADVANCED (GAUZE/BANDAGES/DRESSINGS) ×2
DERMABOND ADVANCED .7 DNX12 (GAUZE/BANDAGES/DRESSINGS) ×3 IMPLANT
DEVICE SUTURE ENDOST 10MM (ENDOMECHANICALS) ×2 IMPLANT
DRAPE GENERAL ENDO 106X123.5 (DRAPES) ×5 IMPLANT
DRAPE LEGGINS SURG 28X43 STRL (DRAPES) ×5 IMPLANT
DRAPE STERI POUCH LG 24X46 STR (DRAPES) ×5 IMPLANT
DRAPE UNDER BUTTOCK W/FLU (DRAPES) ×5 IMPLANT
DRSG TEGADERM 2-3/8X2-3/4 SM (GAUZE/BANDAGES/DRESSINGS) ×15 IMPLANT
GAUZE 4X4 16PLY RFD (DISPOSABLE) ×2 IMPLANT
GLOVE BIO SURGEON STRL SZ7 (GLOVE) ×21 IMPLANT
GLOVE INDICATOR 7.5 STRL GRN (GLOVE) ×19 IMPLANT
GOWN STRL REUS W/ TWL LRG LVL3 (GOWN DISPOSABLE) ×6 IMPLANT
GOWN STRL REUS W/ TWL XL LVL3 (GOWN DISPOSABLE) ×3 IMPLANT
GOWN STRL REUS W/TWL LRG LVL3 (GOWN DISPOSABLE) ×4
GOWN STRL REUS W/TWL XL LVL3 (GOWN DISPOSABLE) ×2
GRASPER SUT TROCAR 14GX15 (MISCELLANEOUS) ×5 IMPLANT
IRRIGATION STRYKERFLOW (MISCELLANEOUS) ×3 IMPLANT
IRRIGATOR STRYKERFLOW (MISCELLANEOUS) ×5
IV NS 1000ML (IV SOLUTION) ×2
IV NS 1000ML BAXH (IV SOLUTION) ×3 IMPLANT
KIT PINK PAD W/HEAD ARE REST (MISCELLANEOUS) ×5
KIT PINK PAD W/HEAD ARM REST (MISCELLANEOUS) ×3 IMPLANT
KIT TURNOVER CYSTO (KITS) ×5 IMPLANT
LABEL OR SOLS (LABEL) ×5 IMPLANT
LIGASURE LAP MARYLAND 5MM 37CM (ELECTROSURGICAL) IMPLANT
LIGASURE VESSEL 5MM BLUNT TIP (ELECTROSURGICAL) ×2 IMPLANT
MANIPULATOR VCARE LG CRV RETR (MISCELLANEOUS) IMPLANT
MANIPULATOR VCARE SML CRV RETR (MISCELLANEOUS) IMPLANT
MANIPULATOR VCARE STD CRV RETR (MISCELLANEOUS) ×2 IMPLANT
NDL FILTER BLUNT 18X1 1/2 (NEEDLE) ×3 IMPLANT
NEEDLE FILTER BLUNT 18X 1/2SAF (NEEDLE) ×2
NEEDLE FILTER BLUNT 18X1 1/2 (NEEDLE) ×3 IMPLANT
NS IRRIG 500ML POUR BTL (IV SOLUTION) ×5 IMPLANT
OCCLUDER COLPOPNEUMO (BALLOONS) ×5 IMPLANT
PACK GYN LAPAROSCOPIC (MISCELLANEOUS) ×5 IMPLANT
PAD OB MATERNITY 4.3X12.25 (PERSONAL CARE ITEMS) ×5 IMPLANT
PAD PREP 24X41 OB/GYN DISP (PERSONAL CARE ITEMS) ×5 IMPLANT
PENCIL ELECTRO HAND CTR (MISCELLANEOUS) ×2 IMPLANT
POUCH SPECIMEN RETRIEVAL 10MM (ENDOMECHANICALS) ×2 IMPLANT
RETRACTOR RING XSMALL (MISCELLANEOUS) IMPLANT
RTRCTR WOUND ALEXIS 13CM XS SH (MISCELLANEOUS) ×5
SCISSORS METZENBAUM CVD 33 (INSTRUMENTS) ×2 IMPLANT
SET CYSTO W/LG BORE CLAMP LF (SET/KITS/TRAYS/PACK) IMPLANT
SET TUBE SMOKE EVAC HIGH FLOW (TUBING) ×5 IMPLANT
SLEEVE ENDOPATH XCEL 5M (ENDOMECHANICALS) ×5 IMPLANT
SPONGE GAUZE 2X2 8PLY STER LF (GAUZE/BANDAGES/DRESSINGS) ×2
SPONGE GAUZE 2X2 8PLY STRL LF (GAUZE/BANDAGES/DRESSINGS) ×8 IMPLANT
STRIP CLOSURE SKIN 1/4X4 (GAUZE/BANDAGES/DRESSINGS) ×4 IMPLANT
SUT ENDO VLOC 180-0-8IN (SUTURE) ×6 IMPLANT
SUT MNCRL 4-0 (SUTURE) ×2
SUT MNCRL 4-0 27XMFL (SUTURE) ×3
SUT MNCRL AB 4-0 PS2 18 (SUTURE) ×5 IMPLANT
SUT VIC AB 0 CT1 36 (SUTURE) ×10 IMPLANT
SUT VIC AB 2-0 UR6 27 (SUTURE) ×5 IMPLANT
SUT VIC AB 4-0 SH 27 (SUTURE) ×2
SUT VIC AB 4-0 SH 27XANBCTRL (SUTURE) ×3 IMPLANT
SUTURE MNCRL 4-0 27XMF (SUTURE) ×3 IMPLANT
SYR 10ML LL (SYRINGE) ×5 IMPLANT
SYR 50ML LL SCALE MARK (SYRINGE) ×5 IMPLANT
SYR 5ML LL (SYRINGE) ×5 IMPLANT
TROCAR BLADELESS 15MM (ENDOMECHANICALS) ×2 IMPLANT
TROCAR ENDO BLADELESS 11MM (ENDOMECHANICALS) ×2 IMPLANT
TROCAR XCEL NON-BLD 5MMX100MML (ENDOMECHANICALS) ×5 IMPLANT
TUBING ART PRESS 48 MALE/FEM (TUBING) IMPLANT
TUBING EVAC SMOKE HEATED PNEUM (TUBING) ×5 IMPLANT
WIPE NON LINTING 3.25X3.25 (MISCELLANEOUS) ×2 IMPLANT

## 2019-03-22 NOTE — Anesthesia Preprocedure Evaluation (Signed)
Anesthesia Evaluation  Patient identified by MRN, date of birth, ID band Patient awake    Reviewed: Allergy & Precautions, NPO status , Patient's Chart, lab work & pertinent test results  History of Anesthesia Complications (+) PONV and history of anesthetic complications  Airway Mallampati: III       Dental   Pulmonary neg pulmonary ROS,    Pulmonary exam normal        Cardiovascular hypertension, + DOE  Normal cardiovascular exam     Neuro/Psych negative neurological ROS  negative psych ROS   GI/Hepatic Neg liver ROS, GERD  ,  Endo/Other  diabetes  Renal/GU negative Renal ROS  Female GU complaint     Musculoskeletal  (+) Arthritis ,   Abdominal Normal abdominal exam  (+)   Peds negative pediatric ROS (+)  Hematology  (+) anemia ,   Anesthesia Other Findings   Reproductive/Obstetrics                             Anesthesia Physical Anesthesia Plan  ASA: II  Anesthesia Plan: General   Post-op Pain Management:    Induction: Intravenous  PONV Risk Score and Plan:   Airway Management Planned: Oral ETT  Additional Equipment:   Intra-op Plan:   Post-operative Plan: Extubation in OR  Informed Consent: I have reviewed the patients History and Physical, chart, labs and discussed the procedure including the risks, benefits and alternatives for the proposed anesthesia with the patient or authorized representative who has indicated his/her understanding and acceptance.     Dental advisory given  Plan Discussed with: CRNA and Surgeon  Anesthesia Plan Comments:         Anesthesia Quick Evaluation

## 2019-03-22 NOTE — Anesthesia Post-op Follow-up Note (Signed)
Anesthesia QCDR form completed.        

## 2019-03-22 NOTE — Interval H&P Note (Signed)
History and Physical Interval Note:  03/22/2019 9:47 AM  Karla Boyer  has presented today for surgery, with the diagnosis of Abnormal Uterine Bleeding.  The various methods of treatment have been discussed with the patient and family. After consideration of risks, benefits and other options for treatment, the patient has consented to  Procedure(s): HYSTERECTOMY TOTAL LAPAROSCOPIC, with hand morcellation (N/A) LAPAROSCOPIC BILATERAL SALPINGECTOMY (Bilateral) as a surgical intervention.  The patient's history has been reviewed, patient examined, no change in status, stable for surgery.  I have reviewed the patient's chart and labs.  Questions were answered to the patient's satisfaction.     Benjaman Kindler

## 2019-03-22 NOTE — Transfer of Care (Signed)
Immediate Anesthesia Transfer of Care Note  Patient: Karla Boyer  Procedure(s) Performed: Procedure(s): HYSTERECTOMY TOTAL LAPAROSCOPIC, with hand morcellation (N/A) LAPAROSCOPIC BILATERAL SALPINGECTOMY (Bilateral) LAPAROSCOPIC LYSIS OF ADHESIONS CYSTOSCOPY  Patient Location: PACU  Anesthesia Type:General  Level of Consciousness: sedated  Airway & Oxygen Therapy: Patient Spontanous Breathing and Patient connected to face mask oxygen  Post-op Assessment: Report given to RN and Post -op Vital signs reviewed and stable  Post vital signs: Reviewed and stable  Last Vitals:  Vitals:   03/22/19 0912 03/22/19 1523  BP: 121/87 112/61  Pulse: 78 86  Resp: 18 14  Temp: (!) 36.3 C 36.9 C  SpO2: 123XX123 123XX123    Complications: No apparent anesthesia complications

## 2019-03-22 NOTE — Discharge Instructions (Addendum)
Discharge instructions after   total laparoscopic hysterectomy   For the next three days, take ibuprofen and acetaminophen on a schedule, every 8 hours. You can take them together or you can intersperse them, and take one every four hours. I also gave you gabapentin for nighttime, to help you sleep and also to control pain. Take gabapentin medicines at night for at least the next 3 nights. You also have a narcotic, oxycodone, to take as needed if the above medicines don't help.  Postop constipation is a major cause of pain. Stay well hydrated, walk as you tolerate, and take over the counter senna as well as stool softeners if you need them.    Signs and Symptoms to Report Call our office at [phone] if you have any of the following.  . Fever over 100.4 degrees or higher . Severe stomach pain not relieved with pain medications . Bright red bleeding that's heavier than a period that does not slow with rest . To go the bathroom a lot (frequency), you can't hold your urine (urgency), or it hurts when you empty your bladder (urinate) . Chest pain . Shortness of breath . Pain in the calves of your legs . Severe nausea and vomiting not relieved with anti-nausea medications . Signs of infection around your wounds, such as redness, hot to touch, swelling, green/yellow drainage (like pus), bad smelling discharge . Any concerns  What You Can Expect after Surgery . You may see some pink tinged, bloody fluid and bruising around the wound. This is normal. . You may notice shoulder and neck pain. This is caused by the gas used during surgery to expand your abdomen so your surgeon could get to the uterus easier. . You may have a sore throat because of the tube in your mouth during general anesthesia. This will go away in 2 to 3 days. . You may have some stomach cramps. . You may notice spotting on your panties. . You may have pain around the incision sites.   Activities after Your  Discharge Follow these guidelines to help speed your recovery at home: . Do the coughing and deep breathing as you did in the hospital for 2 weeks. Use the small blue breathing device, called the incentive spirometer for 2 weeks. . Don't drive if you are in pain or taking narcotic pain medicine. You may drive when you can safely slam on the brakes, turn the wheel forcefully, and rotate your torso comfortably. This is typically 1-2 weeks. Practice in a parking lot or side street prior to attempting to drive regularly.  . Ask others to help with household chores for 4 weeks. . Do not lift anything heavier that 10 pounds for 4-6 weeks. This includes pets, children, and groceries. . Don't do strenuous activities, exercises, or sports like vacuuming, tennis, squash, etc. until your doctor says it is safe to do so. ---Maintain pelvic rest for 8 weeks. This means nothing in the vagina or rectum at all (no douching, tampons, intercourse) for 8 weeks.  . Walk as you feel able. Rest often since it may take two or three weeks for your energy level to return to normal.  . You may climb stairs . Avoid constipation:   -Eat fruits, vegetables, and whole grains. Eat small meals as your appetite will take time to return to normal.   -Drink 6 to 8 glasses of water each day unless your doctor has told you to limit your fluids.   -Use a laxative   or stool softener as needed if constipation becomes a problem. You may take Miralax, metamucil, Citrucil, Colace, Senekot, FiberCon, etc. If this does not relieve the constipation, try two tablespoons of Milk Of Magnesia every 8 hours until your bowels move.   You may shower. Gently wash the wounds with a mild soap and water. Pat dry.  Do not get in a hot tub, swimming pool, etc. for 6 weeks.  Do not use lotions, oils, powders on the wounds.  Do not douche, use tampons, or have sex until your doctor says it is okay.  Take your pain medicine when you need it. The medicine  may not work as well if the pain is bad.  Take the medicines you were taking before surgery. Other medications you will need are pain medications and possibly constipation and nausea medications (Zofran, Reglan).    Here is a helpful article from the website [employer], regarding constipation  Here are reasons why constipation occurs after surgery: 1) During the operation and in the recovery room, most people are given opioid pain medication, primarily through an IV, to treat moderate or severe pain. Intravenous opioids include morphine, Dilaudid and fentanyl. After surgery, patients are often prescribed opioid pain medication to take by mouth at home, including codeine, Vicodin, Norco, and Percocet. All of these medications cause constipation by slowing down the movement of your intestine. 2) Changes in your diet before surgery can be another culprit. It is common to get specific instructions to change how you normally eat or drink before your surgery, like only having liquids the day before or not having anything to eat or drink after midnight the night before surgery. For this reason, temporary dehydration may occur. This, along with not eating or only having liquids, means that you are getting less fiber than usual. Both these factors contribute to constipation. 3) Changes in your diet after surgery can also contribute to the problem. Although many people dont have dietary restrictions after operations, being under anesthesia can make you lose your appetite for several hours and maybe even days. Some people can even have nausea or vomiting. Not eating or drinking normally means that you are not getting enough fiber and you can get dehydrated, both leading to constipation. 4) Lying in a bed more than usual--which happens before, during and after surgery--combined with the medications and diet changes, all work together to slow down your colon and make your poop turn to rock.  No one likes to be  constipated.  Lets face it, its not a pleasant feeling when you dont poop for days, then strain on the toilet to finally pass something large enough to cause damage. An ounce of prevention is worth a pound of cure, so: 1. Assume you will be constipated. 2. Plan and prepare accordingly. Post-surgery is one of those unique situations where the temporary use of laxatives can make a world of difference. Always consult with your doctor, and recognize that if you wait several days after surgery to take a laxative, the constipation might be too severe for these over-the-counter options. It is always important to discuss all medications you plan on taking with your doctor. Ask your doctor if you can start the laxative immediately after surgery. *  Here are go-to post-surgery laxatives: Senna: Senna is an herb that acts as a stimulant laxative, meaning it increases the activity of the intestine to cause you to have a bowel movement. It comes in many forms, but senna pills are easy to take  and are sold over the counter at almost all pharmacies. Since opioid pain medications slow down the activity of the intestine, it makes sense to take a medication to help reverse that side effect. Long-term use of a stimulant laxative is not a good idea since it can make your colon lazy and not function properly; however, temporary use immediately after surgery is acceptable. In general, if you are able to eat a normal diet, taking senna soon after surgery works the best. Senna usually works within hours to produce a bowel movement, but this is less predictable when you are taking different medications after surgery. Try not to wait several days to start taking senna, as often it is too late by then. Just like with all medications or supplements, check with your doctor before starting new treatment.   Magnesium: Magnesium is an important mineral that our body needs. We get magnesium from some foods that we eat, especially  foods that are high in fiber such as broccoli, almonds and whole grains. There are also magnesium-based medications used to treat constipation including milk of magnesia (magnesium hydroxide), magnesium citrate and magnesium oxide. They work by drawing water into the intestine, putting it into the class of osmotic laxatives. Magnesium products in low doses appear to be safe, but if taken in very large doses, can lead to problems such as irregular heartbeat, low blood pressure and even death. It can also affect other medications you might be taking, therefore it is important to discuss using magnesium with your physician and pharmacist before initiating therapy. Most over-the-counter magnesium laxatives work very well to help with the constipation related to surgery, but sometimes they work too well and lead to diarrhea. Make sure you are somewhere with easy access to a bathroom, just in case.   Bisacodyl: Bisacodyl (generic name) is sold under brand names such as Dulcolax. Much like senna, it is a stimulant laxative, meaning it makes your intestines move more quickly to push out the stool. This is another good choice to start taking as soon as your doctor says you can take a laxative after surgery. It comes in pill form and as a suppository, which is a good choice for people who cannot or are not allowed to swallow pills. Studies have shown that it works as a laxative, but like most of these medications, you should use this on a short-term basis only.   Enema: Enemas strike fear in many people, but FEAR NOT! Its nowhere near as big a deal as you may think. An enema is just a way to get some liquid into your rectum by placing a specially designed device through your anus. If you have never done one, it might seem like a painful, unpleasant, uncomfortable, complicated and lengthy procedure. But in reality, its simple, takes just a few seconds and is highly effective. The small ready-made bottles you buy at  the pharmacy are much easier than the hose/large rubber container type. Those recommended positions illustrated in some instructions are generally not necessary to place the enema. Its very similar to the insertion of a tampon, requiring a slight squat. Some extra lubrication on the enemas tip (or on your anus) will make it a breeze. In certain cases, there is no substitute for a good enema. For example, if someone has not pooped for a few days, the beginning of the poop waiting to come out can become rock hard. Passing that hard stool can lead to much pain and problems like anal fissures.  Inserting a little liquid to break up the rock-hard stool will help make its passage much easier. Enemas come with different liquids. Most come with saline, but there are also mineral oil options. You can also use warm water in the reusable enema containers. They all work. But since saline can sometimes be irritating, so try a mineral oil or water enema instead.  Here are commonly recommended constipation medications that do not work well for post-surgery constipation: Docusate: Docusate (generic name) most commonly referred to as Colace (brand name) is not really a laxative, but is classified as a stool softener. Although this medication is commonly prescribed, it is not recommended for several reasons: 1) there is no good medical evidence that it works 2) even if it has an effect, which is very questionable, it is minimal and cannot combat the intestinal slowing caused by the opioid medications. Skip docusate to save money and space in your pillbox for something more effective.  PEG: Miralax (brand name) is basically a chemical called polyethylene glycol (PEG) and it has gained tremendous popularity as a laxative. This product is an osmotic laxative meaning it works by pulling water into the stool, making it softer. This is very similar to the action of natural fiber in foods and supplements. Therefore, the effect seen  by this medication is not immediate, causing a bowel movement in a day or more. Is this medication strong enough to battle the constipation related to having an operation? Maybe for some people not prone to constipation. But for most people, other laxatives are better to prevent constipation after surgery.  AMBULATORY SURGERY  DISCHARGE INSTRUCTIONS   1) The drugs that you were given will stay in your system until tomorrow so for the next 24 hours you should not:  A) Drive an automobile B) Make any legal decisions C) Drink any alcoholic beverage   2) You may resume regular meals tomorrow.  Today it is better to start with liquids and gradually work up to solid foods.  You may eat anything you prefer, but it is better to start with liquids, then soup and crackers, and gradually work up to solid foods.   3) Please notify your doctor immediately if you have any unusual bleeding, trouble breathing, redness and pain at the surgery site, drainage, fever, or pain not relieved by medication.    4) Additional Instructions:        Please contact your physician with any problems or [facility] Day Surgery at [phone], Monday through Friday 6 am to 4 pm, or [place] at [facility] number at [phone].

## 2019-03-22 NOTE — Anesthesia Procedure Notes (Signed)
Procedure Name: Intubation Date/Time: 03/22/2019 10:41 AM Performed by: Justus Memory, CRNA Pre-anesthesia Checklist: Patient identified, Patient being monitored, Timeout performed, Emergency Drugs available and Suction available Patient Re-evaluated:Patient Re-evaluated prior to induction Oxygen Delivery Method: Circle system utilized Preoxygenation: Pre-oxygenation with 100% oxygen Induction Type: IV induction Ventilation: Mask ventilation without difficulty Laryngoscope Size: Mac and 3 Grade View: Grade I Tube type: Oral Tube size: 7.0 mm Number of attempts: 1 Airway Equipment and Method: Stylet Placement Confirmation: ETT inserted through vocal cords under direct vision,  positive ETCO2 and breath sounds checked- equal and bilateral Secured at: 21 cm Tube secured with: Tape Dental Injury: Teeth and Oropharynx as per pre-operative assessment

## 2019-03-22 NOTE — Op Note (Addendum)
Karla Boyer PROCEDURE DATE: 03/22/2019  PREOPERATIVE DIAGNOSIS: Fibroid uterus, enlarged - postmenopausal bleeding - failed conservative measures POSTOPERATIVE DIAGNOSIS: The same - pelvic adhesive disease  PROCEDURE:  HYSTERECTOMY TOTAL LAPAROSCOPIC, with hand morcellation:  LAPAROSCOPIC BILATERAL SALPINGECTOMY:  LAPAROSCOPIC LYSIS OF ADHESIONS:  CYSTOSCOPY:  Uterosacral ligament suspension  This case was made significantly more difficult by the presence of adhesions, and the enlarged fibroid uterus.  It warrants a 22 modifier.  For the adhesions and another one for the enlarged fibroid uterus.  SURGEON:  Dr. Benjaman Kindler ASSISTANT: Dr. Vikki Ports Ward PA-S Jarrett Soho Anesthesiologist:  Anesthesiologist: Molli Barrows, MD; Alvin Critchley, MD CRNA: Doreen Salvage, CRNA; Justus Memory, CRNA; Dionne Bucy, CRNA  INDICATIONS: 58 y.o. F  here for definitive surgical management secondary to the indications listed under preoperative diagnoses; please see preoperative note for further details.  Risks of surgery were discussed with the patient including but not limited to: bleeding which may require transfusion or reoperation; infection which may require antibiotics; injury to bowel, bladder, ureters or other surrounding organs; need for additional procedures; thromboembolic phenomenon, incisional problems and other postoperative/anesthesia complications. Written informed consent was obtained.    FINDINGS:  Severe omental adhesions throughout the abdomen and pelvis, with omentum attached throughout the pelvis, wrapping the uterus, bladder and sidewalls. Her right lobe of liver was like-wise not visible behind the curtain of omentum.   Enlarged fibroid uterus, filling the pelvis laterally.  The final weight was over 400 g.  Normal bladder and cervix. Normal appearing liver except for adhesions from live to anterior abdominal wall.  This case was >50% adhesiolysis, and increased difficulty  dealing with a fibroid uterus with aberrant vessels.  ANESTHESIA:    General INTRAVENOUS FLUIDS:1600  ml ESTIMATED BLOOD LOSS:100 ml URINE OUTPUT: 650 ml   SPECIMENS: Uterus, cervix, bilateral fallopian tubes  COMPLICATIONS: None immediate  PROCEDURE IN DETAIL:  The patient received prophalactic intravenous antibiotics and had sequential compression devices applied to her lower extremities while in the preoperative area.  She was then taken to the operating room where general anesthesia was administered and was found to be adequate.  She was placed in the dorsal lithotomy position, and was prepped and draped in a sterile manner.  A formal time out was performed with all team members present and in agreement.  A V-care uterine manipulator was placed at this time.  A Foley catheter was inserted into her bladder and attached to constant drainage. Attention was turned to the abdomen and 0.5% Marcaine infused subq. A 40mm umbilical incision was made with the scalpel.  The Optiview 5-mm trocar and sleeve were then advanced without difficulty with the laparoscope under direct visualization into the abdomen.  The abdomen was then insufflated with carbon dioxide gas and adequate pneumoperitoneum was obtained.  A survey of the patient's pelvis and abdomen revealed the findings above.   The right lower quadrant was unable to be visualized due to omental adhesions.  The LigaSure device was used to remove these omental adhesions, and the right lower quadrant 5 mm port was placed.   Bilateral lower quadrant ports (5 mm on the right and 11 mm on the left) were then placed under direct visualization.  The pelvis was then carefully examined.  Attention was turned to the omentum, which wrapped the fundus and the left adnexa and left pelvic sidewall.  These were removed.  Attention was turned to the fallopian tubes; these were freed from the underlying mesosalpinx and the uterine attachments using the  Ligasure device.   The bilateral round and broad ligaments were then clamped and transected with the Ligasure device.  The uterine artery was then skeletonized and a bladder flap was created.  The ureters were noted to be safely away from the area of dissection.  The bladder was then bluntly dissected off the lower uterine segment.    At this point, attention was turned to the uterine vessels, which were clamped and cauterized using the Ligasure on the left, and then the right. After the uterine blood flow at the level of the internal os was controlled, both arteries were cut with the Ligasure.  Good hemostasis was noted overall.  The uterosacral and cardinal ligaments were clamped, cut and ligated bilaterally .  Attention was then turned to the cervicovaginal junction, and monopolar scissors were used to transect the cervix from the surrounding vagina using the ring of the V-care as a guide. This was done circumferentially allowing total hysterectomy.  The vaginal cuff incision was then closed with running V-loc suture, being careful to incorporate the uterosacral ligaments with the V lock suture at the midline for a culdoplasty.  The vaginal incision closing required 3V lock sutures to control bleeding.  Overall excellent hemostasis was noted.    Attention was returned to the abdomen.the umbilical port was extended to accommodate a 15 mm Endo Catch bag.  The uterus was attempted to be scooped into this bag, and it was unable to safely be placed in the bag due to its size.  Therefore, the uterus was brought to the umbilical trocar site, and it was removed with hand morcellation.  This portion of the procedure took a significant amount of time.  The ureters were reexamined bilaterally and were pulsating normally. The abdominal pressure was reduced and hemostasis was confirmed.   Cystoscopy showed bilateral ureteral jets.  No stitches were visualized in the bladder during cystoscopy.  The 16mm port fascia was closed with a  vertical mattress with 0-Vicryl, using the cone closure system.  All trocars were removed under direct visualization, and the abdomen was desufflated.  The umbilical port was closed using a running locked 0 Vicryl suture and S retractors.  An mio umbilicus was created.  All skin incisions were closed with 4-0 Vicryl subcuticular stitches and Dermabond. The patient tolerated the procedures well.    All instruments, needles, and sponge counts were correct x 2. The patient was taken to the recovery room awake, extubated and in stable condition.

## 2019-03-24 ENCOUNTER — Encounter: Payer: Self-pay | Admitting: Obstetrics and Gynecology

## 2019-03-26 NOTE — Anesthesia Postprocedure Evaluation (Signed)
Anesthesia Post Note  Patient: Karla Boyer  Procedure(s) Performed: HYSTERECTOMY TOTAL LAPAROSCOPIC, with hand morcellation (N/A ) LAPAROSCOPIC BILATERAL SALPINGECTOMY (Bilateral ) LAPAROSCOPIC LYSIS OF ADHESIONS CYSTOSCOPY  Patient location during evaluation: PACU Anesthesia Type: General Level of consciousness: awake and alert Pain management: pain level controlled Vital Signs Assessment: post-procedure vital signs reviewed and stable Respiratory status: spontaneous breathing, nonlabored ventilation, respiratory function stable and patient connected to nasal cannula oxygen Cardiovascular status: blood pressure returned to baseline and stable Postop Assessment: no apparent nausea or vomiting Anesthetic complications: no     Last Vitals:  Vitals:   03/22/19 1727 03/22/19 1741  BP: (!) 108/53 112/63  Pulse: 78 82  Resp:    Temp: (!) 36.2 C   SpO2: 97% 94%    Last Pain:  Vitals:   03/26/19 0808  TempSrc:   PainSc: 2                  Molli Barrows

## 2019-03-27 LAB — SURGICAL PATHOLOGY

## 2019-11-27 ENCOUNTER — Other Ambulatory Visit: Payer: Self-pay | Admitting: Family Medicine

## 2019-11-27 NOTE — Telephone Encounter (Signed)
Please schedule CPE with fasting labs prior with Dr. Diona Browner.  Needs appointment in order to keep getting refills on her medications.  Send back to me once scheduled so I can refill her medication.

## 2019-11-28 NOTE — Telephone Encounter (Signed)
lvm asking pt to call office °

## 2019-11-29 NOTE — Telephone Encounter (Signed)
Patient returned call.  Patient scheduled lab on 12/03/19 and cpx on 12/06/19.

## 2019-12-03 ENCOUNTER — Other Ambulatory Visit (INDEPENDENT_AMBULATORY_CARE_PROVIDER_SITE_OTHER): Payer: BC Managed Care – PPO

## 2019-12-03 ENCOUNTER — Other Ambulatory Visit: Payer: Self-pay

## 2019-12-03 ENCOUNTER — Telehealth: Payer: Self-pay | Admitting: Family Medicine

## 2019-12-03 DIAGNOSIS — D509 Iron deficiency anemia, unspecified: Secondary | ICD-10-CM | POA: Diagnosis not present

## 2019-12-03 DIAGNOSIS — E119 Type 2 diabetes mellitus without complications: Secondary | ICD-10-CM

## 2019-12-03 DIAGNOSIS — R7303 Prediabetes: Secondary | ICD-10-CM

## 2019-12-03 LAB — LIPID PANEL
Cholesterol: 171 mg/dL (ref 0–200)
HDL: 36.7 mg/dL — ABNORMAL LOW (ref >39.00)
LDL Cholesterol: 110 mg/dL — ABNORMAL HIGH (ref 0–99)
NonHDL: 134.79
Total CHOL/HDL Ratio: 5
Triglycerides: 125 mg/dL (ref 0.0–149.0)
VLDL: 25 mg/dL (ref 0.0–40.0)

## 2019-12-03 LAB — COMPREHENSIVE METABOLIC PANEL
ALT: 29 U/L (ref 0–35)
AST: 25 U/L (ref 0–37)
Albumin: 4.4 g/dL (ref 3.5–5.2)
Alkaline Phosphatase: 65 U/L (ref 39–117)
BUN: 11 mg/dL (ref 6–23)
CO2: 29 mEq/L (ref 19–32)
Calcium: 8.9 mg/dL (ref 8.4–10.5)
Chloride: 103 mEq/L (ref 96–112)
Creatinine, Ser: 0.8 mg/dL (ref 0.40–1.20)
GFR: 73.97 mL/min (ref >60.00)
Glucose, Bld: 112 mg/dL — ABNORMAL HIGH (ref 70–99)
Potassium: 4 mEq/L (ref 3.5–5.1)
Sodium: 138 mEq/L (ref 135–145)
Total Bilirubin: 0.5 mg/dL (ref 0.2–1.2)
Total Protein: 6.7 g/dL (ref 6.0–8.3)

## 2019-12-03 LAB — CBC WITH DIFFERENTIAL/PLATELET
Basophils Absolute: 0 10*3/uL (ref 0.0–0.1)
Basophils Relative: 0.8 % (ref 0.0–3.0)
Eosinophils Absolute: 0.1 10*3/uL (ref 0.0–0.7)
Eosinophils Relative: 1.8 % (ref 0.0–5.0)
HCT: 45.5 % (ref 36.0–46.0)
Hemoglobin: 15 g/dL (ref 12.0–15.0)
Lymphocytes Relative: 23.6 % (ref 12.0–46.0)
Lymphs Abs: 1.4 10*3/uL (ref 0.7–4.0)
MCHC: 32.9 g/dL (ref 30.0–36.0)
MCV: 85.1 fl (ref 78.0–100.0)
Monocytes Absolute: 0.5 10*3/uL (ref 0.1–1.0)
Monocytes Relative: 8.4 % (ref 3.0–12.0)
Neutro Abs: 3.8 10*3/uL (ref 1.4–7.7)
Neutrophils Relative %: 65.4 % (ref 43.0–77.0)
Platelets: 288 10*3/uL (ref 150.0–400.0)
RBC: 5.34 Mil/uL — ABNORMAL HIGH (ref 3.87–5.11)
RDW: 13.6 % (ref 11.5–15.5)
WBC: 5.9 10*3/uL (ref 4.0–10.5)

## 2019-12-03 LAB — HEMOGLOBIN A1C: Hgb A1c MFr Bld: 6.4 % (ref 4.6–6.5)

## 2019-12-03 NOTE — Telephone Encounter (Signed)
-----   Message from Ellamae Sia sent at 12/02/2019 11:45 AM EDT ----- Regarding: Lab orders for Tuesday, 5.18.21 Patient is scheduled for CPX labs, please order future labs, Thanks , Karna Christmas

## 2019-12-04 LAB — IBC + FERRITIN
Ferritin: 108.2 ng/mL (ref 10.0–291.0)
Iron: 99 ug/dL (ref 42–145)
Saturation Ratios: 22.8 % (ref 20.0–50.0)
Transferrin: 310 mg/dL (ref 212.0–360.0)

## 2019-12-05 NOTE — Progress Notes (Signed)
No critical labs need to be addressed urgently. We will discuss labs in detail at upcoming office visit.   

## 2019-12-06 ENCOUNTER — Other Ambulatory Visit: Payer: Self-pay

## 2019-12-06 ENCOUNTER — Ambulatory Visit (INDEPENDENT_AMBULATORY_CARE_PROVIDER_SITE_OTHER): Payer: BC Managed Care – PPO | Admitting: Family Medicine

## 2019-12-06 ENCOUNTER — Encounter: Payer: Self-pay | Admitting: Family Medicine

## 2019-12-06 VITALS — BP 120/80 | HR 93 | Temp 97.6°F | Ht 63.5 in | Wt 148.5 lb

## 2019-12-06 DIAGNOSIS — E785 Hyperlipidemia, unspecified: Secondary | ICD-10-CM | POA: Insufficient documentation

## 2019-12-06 DIAGNOSIS — Z0000 Encounter for general adult medical examination without abnormal findings: Secondary | ICD-10-CM | POA: Diagnosis not present

## 2019-12-06 DIAGNOSIS — I10 Essential (primary) hypertension: Secondary | ICD-10-CM

## 2019-12-06 DIAGNOSIS — E1169 Type 2 diabetes mellitus with other specified complication: Secondary | ICD-10-CM | POA: Insufficient documentation

## 2019-12-06 DIAGNOSIS — E7800 Pure hypercholesterolemia, unspecified: Secondary | ICD-10-CM

## 2019-12-06 DIAGNOSIS — E119 Type 2 diabetes mellitus without complications: Secondary | ICD-10-CM | POA: Diagnosis not present

## 2019-12-06 DIAGNOSIS — D509 Iron deficiency anemia, unspecified: Secondary | ICD-10-CM

## 2019-12-06 DIAGNOSIS — Z23 Encounter for immunization: Secondary | ICD-10-CM | POA: Diagnosis not present

## 2019-12-06 DIAGNOSIS — Z85828 Personal history of other malignant neoplasm of skin: Secondary | ICD-10-CM | POA: Insufficient documentation

## 2019-12-06 LAB — HM DIABETES FOOT EXAM

## 2019-12-06 MED ORDER — ATORVASTATIN CALCIUM 10 MG PO TABS: 10.0000 mg | ORAL_TABLET | Freq: Every day | ORAL | 11 refills | Status: DC

## 2019-12-06 NOTE — Progress Notes (Signed)
History of Present Illness: HPI The patient is here for annual wellness exam and preventative care.    Hypertension:    At goal on losartan HCTZ. BP Readings from Last 3 Encounters:  12/06/19 120/80  03/22/19 112/63  03/19/19 138/89  Using medication without problems or lightheadedness: none Chest pain with exertion: none Edema:none Short of breath:none Average home BPs: 120/70s Other issues:   GERD: Stable control on omeprazole.  Diabetes:   At goal with diet Lab Results  Component Value Date   HGBA1C 6.4 12/03/2019  Using medications without difficulties: Hypoglycemic episodes: Hyperglycemic episodes: Feet problems: no ulcers Blood Sugars averaging: not checking eye exam within last year:   Working on low carb diet. Exercise: minimal.   High cholesterol.. LDL not at goal on no statin. Lab Results  Component Value Date   CHOL 171 12/03/2019   HDL 36.70 (L) 12/03/2019   LDLCALC 110 (H) 12/03/2019   TRIG 125.0 12/03/2019   CHOLHDL 5 12/03/2019    Anemia  Resolved after hyst erectomy Lab Results  Component Value Date   WBC 5.9 12/03/2019   HGB 15.0 12/03/2019   HCT 45.5 12/03/2019   MCV 85.1 12/03/2019   PLT 288.0 12/03/2019      This visit occurred during the SARS-CoV-2 public health emergency.  Safety protocols were in place, including screening questions prior to the visit, additional usage of staff PPE, and extensive cleaning of exam room while observing appropriate contact time as indicated for disinfecting solutions.   COVID 19 screen:  No recent travel or known exposure to COVID19 The patient denies respiratory symptoms of COVID 19 at this time. The importance of social distancing was discussed today.     Review of Systems  Constitutional: Negative for chills and fever.  HENT: Negative for congestion and ear pain.   Eyes: Negative for pain and redness.  Respiratory: Negative for cough and shortness of breath.   Cardiovascular: Negative for  chest pain, palpitations and leg swelling.  Gastrointestinal: Negative for abdominal pain, blood in stool, constipation, diarrhea, nausea and vomiting.  Genitourinary: Negative for dysuria.  Musculoskeletal: Negative for falls and myalgias.  Skin: Negative for rash.  Neurological: Negative for dizziness.  Psychiatric/Behavioral: Negative for depression. The patient is not nervous/anxious.       Past Medical History:  Diagnosis Date  . Anemia   . Arthritis    thumb  . Complication of anesthesia   . GERD (gastroesophageal reflux disease)    occ   . Hypertension   . Other ganglion and cyst of synovium, tendon, and bursa(727.49)   . PONV (postoperative nausea and vomiting)    nausea  . Unspecified tinnitus     reports that she has never smoked. She has never used smokeless tobacco. She reports that she does not drink alcohol or use drugs.   Current Outpatient Medications:  .  docusate sodium (COLACE) 100 MG capsule, Take 1 capsule (100 mg total) by mouth 2 (two) times daily. To keep stools soft, Disp: 30 capsule, Rfl: 0 .  ferrous sulfate 325 (65 FE) MG tablet, Take 325 mg by mouth daily with breakfast., Disp: , Rfl:  .  gabapentin (NEURONTIN) 800 MG tablet, Take 1 tablet (800 mg total) by mouth at bedtime for 14 days. Take nightly for 3 days, then up to 14 days as needed, Disp: 14 tablet, Rfl: 0 .  losartan-hydrochlorothiazide (HYZAAR) 50-12.5 MG tablet, TAKE 1 TABLET BY MOUTH EVERY DAY, Disp: 90 tablet, Rfl: 0 .  metoCLOPramide (  REGLAN) 10 MG tablet, Take 1 tablet (10 mg total) by mouth every 8 (eight) hours as needed for nausea. With meals if needed, Disp: 90 tablet, Rfl: 1 .  omeprazole (PRILOSEC) 20 MG capsule, Take 20 mg by mouth as needed., Disp: , Rfl:  .  oxycodone (OXY-IR) 5 MG capsule, Take 1 capsule (5 mg total) by mouth every 6 (six) hours as needed for pain., Disp: 15 capsule, Rfl: 0   Observations/Objective: Blood pressure 120/80, pulse 93, temperature 97.6 F (36.4 C),  temperature source Temporal, height 5' 3.5" (1.613 m), weight 148 lb 8 oz (67.4 kg), last menstrual period 12/22/2018, SpO2 98 %.   Physical Exam Constitutional:      General: She is not in acute distress.    Appearance: Normal appearance. She is well-developed. She is not ill-appearing or toxic-appearing.  HENT:     Head: Normocephalic.     Right Ear: Hearing, tympanic membrane, ear canal and external ear normal.     Left Ear: Hearing, tympanic membrane, ear canal and external ear normal.     Nose: Nose normal.  Eyes:     General: Lids are normal. Lids are everted, no foreign bodies appreciated.     Conjunctiva/sclera: Conjunctivae normal.     Pupils: Pupils are equal, round, and reactive to light.  Neck:     Thyroid: No thyroid mass or thyromegaly.     Vascular: No carotid bruit.     Trachea: Trachea normal.  Cardiovascular:     Rate and Rhythm: Normal rate and regular rhythm.     Heart sounds: Normal heart sounds, S1 normal and S2 normal. No murmur. No gallop.   Pulmonary:     Effort: Pulmonary effort is normal. No respiratory distress.     Breath sounds: Normal breath sounds. No wheezing, rhonchi or rales.  Abdominal:     General: Bowel sounds are normal. There is no distension or abdominal bruit.     Palpations: Abdomen is soft. There is no fluid wave or mass.     Tenderness: There is no abdominal tenderness. There is no guarding or rebound.     Hernia: No hernia is present.  Musculoskeletal:     Cervical back: Normal range of motion and neck supple.  Lymphadenopathy:     Cervical: No cervical adenopathy.  Skin:    General: Skin is warm and dry.     Findings: No rash.  Neurological:     Mental Status: She is alert.     Cranial Nerves: No cranial nerve deficit.     Sensory: No sensory deficit.  Psychiatric:        Mood and Affect: Mood is not anxious or depressed.        Speech: Speech normal.        Behavior: Behavior normal. Behavior is cooperative.         Judgment: Judgment normal.      Diabetic foot exam: Normal inspection No skin breakdown No calluses  Normal DP pulses Normal sensation to light touch and monofilament Nails normal  Assessment and Plan The patient's preventative maintenance and recommended screening tests for an annual wellness exam were reviewed in full today. Brought up to date unless services declined.  Counselled on the importance of diet, exercise, and its role in overall health and mortality. The patient's FH and SH was reviewed, including their home life, tobacco status, and drug and alcohol status.   Vaccines: Due for  Pneumovax given DM Pap/DVE: 2017 last pap...GYN Dr.  Beasley S/P partial Hysterectomy for fibroids 03/2019 Mammo:  11/2016 Bone Density: no indication Colon:  04/26/2017, Dr. Gustavo Lah tubular adenoma repeat in 5 years Smoking Status: none ETOH/ drug use: none/none Hep C:  done HIV screen:  refused   Controlled type 2 diabetes mellitus without complication, without long-term current use of insulin (HCC) Low carb diet, exercise... diet controlled.   High cholesterol Start low to moderate dose statin given DM and LDL . 100. Encouraged exercise, weight loss, healthy eating habits.   HTN (hypertension) Well controlled. Continue current medication.   Microcytic anemia Resolved s./p hysterectomy. Okay to stop iron.     Eliezer Lofts, MD

## 2019-12-06 NOTE — Assessment & Plan Note (Signed)
Well controlled. Continue current medication.  

## 2019-12-06 NOTE — Addendum Note (Signed)
Addended by: Carter Kitten on: 12/06/2019 11:45 AM   Modules accepted: Orders

## 2019-12-06 NOTE — Assessment & Plan Note (Signed)
Start low to moderate dose statin given DM and LDL . 100. Encouraged exercise, weight loss, healthy eating habits.

## 2019-12-06 NOTE — Patient Instructions (Addendum)
Start cholesterol medication  to lower heart disease risk.  Work on low carb low fat diet. Okay to stop iron.  Please call the location of your choice from the menu below to schedule your Mammogram and/or Bone Density appointment.    [place]  1. [place] at [facility]   Phone:  [phone]   [place], [place] [address]                                            Services: 3D Mammogram and Bone Density  2. [place] at [facility])  Phone:  [phone]   [address] [place], [place] [address]                                              Services:  3D Mammogram and Bone Density

## 2019-12-06 NOTE — Assessment & Plan Note (Signed)
Resolved s./p hysterectomy. Okay to stop iron.

## 2019-12-06 NOTE — Assessment & Plan Note (Signed)
Low carb diet, exercise... diet controlled.

## 2019-12-10 ENCOUNTER — Other Ambulatory Visit: Payer: Self-pay | Admitting: Family Medicine

## 2019-12-10 DIAGNOSIS — Z1231 Encounter for screening mammogram for malignant neoplasm of breast: Secondary | ICD-10-CM

## 2019-12-19 ENCOUNTER — Ambulatory Visit
Admission: RE | Admit: 2019-12-19 | Discharge: 2019-12-19 | Disposition: A | Discharge status: Home / Self Care | Payer: BC Managed Care – PPO | Source: Ambulatory Visit | Attending: Family Medicine | Admitting: Family Medicine

## 2019-12-19 DIAGNOSIS — Z1231 Encounter for screening mammogram for malignant neoplasm of breast: Secondary | ICD-10-CM | POA: Diagnosis present

## 2020-02-28 ENCOUNTER — Other Ambulatory Visit: Payer: Self-pay | Admitting: Family Medicine

## 2020-05-29 ENCOUNTER — Telehealth: Payer: Self-pay | Admitting: Family Medicine

## 2020-05-29 DIAGNOSIS — E119 Type 2 diabetes mellitus without complications: Secondary | ICD-10-CM

## 2020-05-29 DIAGNOSIS — D509 Iron deficiency anemia, unspecified: Secondary | ICD-10-CM

## 2020-05-29 NOTE — Telephone Encounter (Signed)
-----   Message from Ellamae Sia sent at 05/21/2020  2:37 PM EDT ----- Regarding: Lab orders for Monday, 11.15.21 Lab orders for f/u

## 2020-06-01 ENCOUNTER — Other Ambulatory Visit: Payer: BC Managed Care – PPO

## 2020-06-01 ENCOUNTER — Telehealth: Payer: Self-pay

## 2020-06-01 NOTE — Telephone Encounter (Signed)
[place] Night - Client Nonclinical Telephone Record AccessNurse Client [place] Night - Client Client Site [place] Physician Eliezer Lofts - MD Contact Type Call Who Is Calling Patient / Member / Family / Caregiver Caller Name Karla Boyer Phone Number [phone] Patient Name Karla Boyer Patient DOB 04-Sep-1960 Call Type Message Only Information Provided Reason for Call Request to [place] Appointment Initial Comment Caller states she needs to cancel todays lab appt and friday's office visit. Additional Comment office hours provided Disp. Time Disposition Final User 06/01/2020 7:11:17 AM General Information Provided Yes Idolina Primer Call Closed By: Idolina Primer Transaction Date/Time: 06/01/2020 7:09:19 AM (ET)

## 2020-06-01 NOTE — Telephone Encounter (Signed)
Appointment canceled.

## 2020-06-05 ENCOUNTER — Ambulatory Visit: Payer: BC Managed Care – PPO | Admitting: Family Medicine

## 2020-07-20 ENCOUNTER — Other Ambulatory Visit (INDEPENDENT_AMBULATORY_CARE_PROVIDER_SITE_OTHER): Payer: BC Managed Care – PPO

## 2020-07-20 ENCOUNTER — Other Ambulatory Visit: Payer: Self-pay

## 2020-07-20 DIAGNOSIS — E119 Type 2 diabetes mellitus without complications: Secondary | ICD-10-CM

## 2020-07-20 DIAGNOSIS — D509 Iron deficiency anemia, unspecified: Secondary | ICD-10-CM | POA: Diagnosis not present

## 2020-07-20 LAB — CBC WITH DIFFERENTIAL/PLATELET
Basophils Absolute: 0 10*3/uL (ref 0.0–0.1)
Basophils Relative: 0.4 % (ref 0.0–3.0)
Eosinophils Absolute: 0.2 10*3/uL (ref 0.0–0.7)
Eosinophils Relative: 3.8 % (ref 0.0–5.0)
HCT: 44.1 % (ref 36.0–46.0)
Hemoglobin: 14.8 g/dL (ref 12.0–15.0)
Lymphocytes Relative: 21.6 % (ref 12.0–46.0)
Lymphs Abs: 1 10*3/uL (ref 0.7–4.0)
MCHC: 33.6 g/dL (ref 30.0–36.0)
MCV: 83.1 fl (ref 78.0–100.0)
Monocytes Absolute: 0.6 10*3/uL (ref 0.1–1.0)
Monocytes Relative: 13.6 % — ABNORMAL HIGH (ref 3.0–12.0)
Neutro Abs: 2.8 10*3/uL (ref 1.4–7.7)
Neutrophils Relative %: 60.6 % (ref 43.0–77.0)
Platelets: 206 10*3/uL (ref 150.0–400.0)
RBC: 5.31 Mil/uL — ABNORMAL HIGH (ref 3.87–5.11)
RDW: 13.5 % (ref 11.5–15.5)
WBC: 4.6 10*3/uL (ref 4.0–10.5)

## 2020-07-20 LAB — COMPREHENSIVE METABOLIC PANEL
ALT: 38 U/L — ABNORMAL HIGH (ref 0–35)
AST: 31 U/L (ref 0–37)
Albumin: 4.6 g/dL (ref 3.5–5.2)
Alkaline Phosphatase: 61 U/L (ref 39–117)
BUN: 10 mg/dL (ref 6–23)
CO2: 31 mEq/L (ref 19–32)
Calcium: 9.4 mg/dL (ref 8.4–10.5)
Chloride: 102 mEq/L (ref 96–112)
Creatinine, Ser: 0.91 mg/dL (ref 0.40–1.20)
GFR: 70.05 mL/min (ref >60.00)
Glucose, Bld: 105 mg/dL — ABNORMAL HIGH (ref 70–99)
Potassium: 4.4 mEq/L (ref 3.5–5.1)
Sodium: 138 mEq/L (ref 135–145)
Total Bilirubin: 0.6 mg/dL (ref 0.2–1.2)
Total Protein: 6.9 g/dL (ref 6.0–8.3)

## 2020-07-20 LAB — LIPID PANEL
Cholesterol: 194 mg/dL (ref 0–200)
HDL: 39.5 mg/dL (ref >39.00)
LDL Cholesterol: 128 mg/dL — ABNORMAL HIGH (ref 0–99)
NonHDL: 154.5
Total CHOL/HDL Ratio: 5
Triglycerides: 131 mg/dL (ref 0.0–149.0)
VLDL: 26.2 mg/dL (ref 0.0–40.0)

## 2020-07-20 LAB — HEMOGLOBIN A1C: Hgb A1c MFr Bld: 6.6 % — ABNORMAL HIGH (ref 4.6–6.5)

## 2020-07-20 NOTE — Progress Notes (Signed)
No critical labs need to be addressed urgently. We will discuss labs in detail at upcoming office visit.   

## 2020-07-28 ENCOUNTER — Ambulatory Visit: Payer: BC Managed Care – PPO | Admitting: Family Medicine

## 2020-09-05 ENCOUNTER — Other Ambulatory Visit: Payer: Self-pay | Admitting: Family Medicine

## 2020-09-07 NOTE — Telephone Encounter (Signed)
Left voice message to call the office  

## 2020-09-07 NOTE — Telephone Encounter (Signed)
Please try and schedule patient for diabetes follow up with Dr. Diona Browner.  Looks like she cancelled her last appointment.

## 2020-09-16 NOTE — Telephone Encounter (Signed)
Left voice message to call the office  

## 2020-09-18 NOTE — Telephone Encounter (Signed)
Left voice message for patient  to call the office . Letter sent

## 2020-11-13 ENCOUNTER — Telehealth: Payer: Self-pay | Admitting: Family Medicine

## 2020-11-13 ENCOUNTER — Encounter: Payer: BC Managed Care – PPO | Admitting: Family Medicine

## 2020-11-13 NOTE — Telephone Encounter (Signed)
Lvm asking pt to call office to reschedule appt today with [facility] due to provider being out of office

## 2020-12-16 ENCOUNTER — Other Ambulatory Visit: Payer: Self-pay | Admitting: Family Medicine

## 2020-12-16 NOTE — Telephone Encounter (Signed)
Pharmacy requests refill on: Losartan-HCTZ 50-12.5 mg   LAST REFILL: 09/07/2020 (Q-90, R-0) LAST OV: 12/05/2020 NEXT OV: Not Scheduled  PHARMACY: [id number] [place], [place]

## 2020-12-17 NOTE — Telephone Encounter (Signed)
Left voice message to call the office  

## 2020-12-30 LAB — HM DIABETES EYE EXAM

## 2021-01-05 NOTE — Telephone Encounter (Signed)
Left voice message to call the office  

## 2021-01-13 ENCOUNTER — Other Ambulatory Visit: Payer: Self-pay | Admitting: Family Medicine

## 2021-01-20 ENCOUNTER — Telehealth: Payer: Self-pay | Admitting: Family Medicine

## 2021-01-20 NOTE — Telephone Encounter (Signed)
Left voice message to call the office . Letter sent  

## 2021-01-21 NOTE — Telephone Encounter (Signed)
error 

## 2021-03-04 ENCOUNTER — Telehealth: Payer: Self-pay | Admitting: Family Medicine

## 2021-03-04 DIAGNOSIS — E119 Type 2 diabetes mellitus without complications: Secondary | ICD-10-CM

## 2021-03-04 DIAGNOSIS — D509 Iron deficiency anemia, unspecified: Secondary | ICD-10-CM

## 2021-03-04 NOTE — Telephone Encounter (Signed)
Labs

## 2021-03-05 ENCOUNTER — Other Ambulatory Visit (INDEPENDENT_AMBULATORY_CARE_PROVIDER_SITE_OTHER): Payer: BC Managed Care – PPO

## 2021-03-05 ENCOUNTER — Other Ambulatory Visit: Payer: Self-pay

## 2021-03-05 DIAGNOSIS — E119 Type 2 diabetes mellitus without complications: Secondary | ICD-10-CM

## 2021-03-05 LAB — COMPREHENSIVE METABOLIC PANEL
ALT: 28 U/L (ref 0–35)
AST: 20 U/L (ref 0–37)
Albumin: 4.3 g/dL (ref 3.5–5.2)
Alkaline Phosphatase: 74 U/L (ref 39–117)
BUN: 13 mg/dL (ref 6–23)
CO2: 28 mEq/L (ref 19–32)
Calcium: 9.5 mg/dL (ref 8.4–10.5)
Chloride: 103 mEq/L (ref 96–112)
Creatinine, Ser: 0.89 mg/dL (ref 0.40–1.20)
GFR: 71.63 mL/min (ref >60.00)
Glucose, Bld: 104 mg/dL — ABNORMAL HIGH (ref 70–99)
Potassium: 4.3 mEq/L (ref 3.5–5.1)
Sodium: 139 mEq/L (ref 135–145)
Total Bilirubin: 0.5 mg/dL (ref 0.2–1.2)
Total Protein: 6.8 g/dL (ref 6.0–8.3)

## 2021-03-05 LAB — LIPID PANEL
Cholesterol: 195 mg/dL (ref 0–200)
HDL: 45.4 mg/dL (ref >39.00)
LDL Cholesterol: 116 mg/dL — ABNORMAL HIGH (ref 0–99)
NonHDL: 149.26
Total CHOL/HDL Ratio: 4
Triglycerides: 167 mg/dL — ABNORMAL HIGH (ref 0.0–149.0)
VLDL: 33.4 mg/dL (ref 0.0–40.0)

## 2021-03-05 LAB — HEMOGLOBIN A1C: Hgb A1c MFr Bld: 6.6 % — ABNORMAL HIGH (ref 4.6–6.5)

## 2021-03-05 NOTE — Progress Notes (Signed)
No critical labs need to be addressed urgently. We will discuss labs in detail at upcoming office visit.   

## 2021-03-09 ENCOUNTER — Encounter: Payer: Self-pay | Admitting: Family Medicine

## 2021-03-09 ENCOUNTER — Ambulatory Visit (INDEPENDENT_AMBULATORY_CARE_PROVIDER_SITE_OTHER): Payer: BC Managed Care – PPO | Admitting: Family Medicine

## 2021-03-09 ENCOUNTER — Other Ambulatory Visit: Payer: Self-pay

## 2021-03-09 VITALS — BP 140/88 | HR 69 | Temp 98.0°F | Ht 63.5 in | Wt 150.2 lb

## 2021-03-09 DIAGNOSIS — E1169 Type 2 diabetes mellitus with other specified complication: Secondary | ICD-10-CM | POA: Diagnosis not present

## 2021-03-09 DIAGNOSIS — Z0000 Encounter for general adult medical examination without abnormal findings: Secondary | ICD-10-CM

## 2021-03-09 DIAGNOSIS — I152 Hypertension secondary to endocrine disorders: Secondary | ICD-10-CM

## 2021-03-09 DIAGNOSIS — E119 Type 2 diabetes mellitus without complications: Secondary | ICD-10-CM | POA: Diagnosis not present

## 2021-03-09 DIAGNOSIS — E785 Hyperlipidemia, unspecified: Secondary | ICD-10-CM

## 2021-03-09 DIAGNOSIS — E1159 Type 2 diabetes mellitus with other circulatory complications: Secondary | ICD-10-CM

## 2021-03-09 MED ORDER — LOSARTAN POTASSIUM-HCTZ 50-12.5 MG PO TABS: 1.0000 | ORAL_TABLET | Freq: Every day | ORAL | 3 refills | Status: DC

## 2021-03-09 MED ORDER — ATORVASTATIN CALCIUM 10 MG PO TABS: 10.0000 mg | ORAL_TABLET | Freq: Every day | ORAL | 3 refills | Status: DC

## 2021-03-09 NOTE — Assessment & Plan Note (Addendum)
Stable, chronic. Good control at home.Continue current medication.   losartan HCTZ 50/12.5 mg daily

## 2021-03-09 NOTE — Assessment & Plan Note (Signed)
Chronic , inadequate control.  Unable to tolerate daily atorvastatin 10 mg.. try CoQ 10 or 1/2 tab daily.  re-eval in 3 months  Work on [employer], reviewed.

## 2021-03-09 NOTE — Patient Instructions (Addendum)
Continue atorvastatin every other day.. can try the CoQ10 to see if you can tolerate the daily dose.  Work on exercise and low carb , low cholesterol diet.  Consider shingrix vaccine.  Please call the location of your choice from the menu below to schedule your Mammogram and/or Bone Density appointment.    [facility]                      Phone:  [phone] [address] [place], [place] [address]                                                             Services: Traditional and 3D Mammogram, [place] Bone Density                 Phone: [phone] [address], [place] [address]    Service: Bone Density ONLY   *this site does NOT perform mammograms  [place]                        Phone:  [phone] [address], [place] [address]                                            Services:  3D Mammogram and [place] at [facility]   Phone:  [phone]   [place], [place] [address]                                            Services: 3D Mammogram and [place] at [facility])  Phone:  [phone]   [address]  [place], [place] [address]                                              Services:  3D Mammogram and Bone Density  

## 2021-03-09 NOTE — Assessment & Plan Note (Signed)
Stable, chronic. Diet controlled. Encouraged exercise, weight loss, healthy eating habits.

## 2021-03-09 NOTE — Progress Notes (Signed)
Patient ID: Karla Boyer, female    DOB: Sep 19, 1960, 60 y.o.   MRN: [id number]  This visit was conducted in person.  BP 140/88   Pulse 69   Temp 98 F (36.7 C) (Temporal)   Ht 5' 3.5" (1.613 m)   Wt 150 lb 4 oz (68.2 kg)   LMP 12/22/2018 (Exact Date)   SpO2 97%   BMI 26.20 kg/m    CC: Chief Complaint  Patient presents with   Annual Exam    Subjective:   HPI: Karla Boyer is a 60 y.o. female presenting on 03/09/2021 for Annual Exam   Menopause symptoms, hot flashes, keeping her up at night.. no mood issues, vaginal dryness. She has tried and failed black cohosh.  2 years ago had hysterectomy.  Elevated Cholesterol:  On atorvastatin 10 mg every other day.  Lab Results  Component Value Date   CHOL 195 03/05/2021   HDL 45.40 03/05/2021   LDLCALC 116 (H) 03/05/2021   TRIG 167.0 (H) 03/05/2021   CHOLHDL 4 03/05/2021  Using medications without problems: Muscle aches:  Diet compliance: moderate Exercise: minimal Other complaints:  Hypertension:   Borderline control on losartan HCTZ 50/12.5 mg daily BP Readings from Last 3 Encounters:  03/09/21 140/88  12/06/19 120/80  03/22/19 112/63  Using medication without problems or lightheadedness:  none Chest pain with exertion:none Edema:none Short of breath:none Average home BPs: 135/80 Other issues:  Diabetes:  Diet controlled  Lab Results  Component Value Date   HGBA1C 6.6 (H) 03/05/2021  Using medications without difficulties: Hypoglycemic episodes: Hyperglycemic episodes: Feet problems: no ulcers Blood Sugars averaging: no checking eye exam within last year: yes      Relevant past medical, surgical, family and social history reviewed and updated as indicated. Interim medical history since our last visit reviewed. Allergies and medications reviewed and updated. Outpatient Medications Prior to Visit  Medication Sig Dispense Refill   atorvastatin (LIPITOR) 10 MG tablet Take 1 tablet (10 mg total)  by mouth daily. 30 tablet 11   losartan-hydrochlorothiazide (HYZAAR) 50-12.5 MG tablet TAKE 1 TABLET BY MOUTH EVERY DAY 30 tablet 0   omeprazole (PRILOSEC) 20 MG capsule Take 20 mg by mouth as needed.     ferrous sulfate 325 (65 FE) MG tablet Take 325 mg by mouth daily with breakfast.     No facility-administered medications prior to visit.     Per HPI unless specifically indicated in ROS section below Review of Systems  Constitutional:  Negative for fatigue and fever.  HENT:  Negative for congestion.   Eyes:  Negative for pain.  Respiratory:  Negative for cough and shortness of breath.   Cardiovascular:  Negative for chest pain, palpitations and leg swelling.  Gastrointestinal:  Negative for abdominal pain.  Genitourinary:  Negative for dysuria and vaginal bleeding.  Musculoskeletal:  Negative for back pain.  Neurological:  Negative for syncope, light-headedness and headaches.  Psychiatric/Behavioral:  Negative for dysphoric mood.   Objective:  BP 140/88   Pulse 69   Temp 98 F (36.7 C) (Temporal)   Ht 5' 3.5" (1.613 m)   Wt 150 lb 4 oz (68.2 kg)   LMP 12/22/2018 (Exact Date)   SpO2 97%   BMI 26.20 kg/m   Wt Readings from Last 3 Encounters:  03/09/21 150 lb 4 oz (68.2 kg)  12/06/19 148 lb 8 oz (67.4 kg)  03/22/19 148 lb (67.1 kg)      Physical Exam Constitutional:  General: She is not in acute distress.    Appearance: Normal appearance. She is well-developed. She is not ill-appearing or toxic-appearing.  HENT:     Head: Normocephalic.     Right Ear: Hearing, tympanic membrane, ear canal and external ear normal.     Left Ear: Hearing, tympanic membrane, ear canal and external ear normal.     Nose: Nose normal.  Eyes:     General: Lids are normal. Lids are everted, no foreign bodies appreciated.     Conjunctiva/sclera: Conjunctivae normal.     Pupils: Pupils are equal, round, and reactive to light.  Neck:     Thyroid: No thyroid mass or thyromegaly.      Vascular: No carotid bruit.     Trachea: Trachea normal.  Cardiovascular:     Rate and Rhythm: Normal rate and regular rhythm.     Heart sounds: Normal heart sounds, S1 normal and S2 normal. No murmur heard.   No gallop.  Pulmonary:     Effort: Pulmonary effort is normal. No respiratory distress.     Breath sounds: Normal breath sounds. No wheezing, rhonchi or rales.  Abdominal:     General: Bowel sounds are normal. There is no distension or abdominal bruit.     Palpations: Abdomen is soft. There is no fluid wave or mass.     Tenderness: There is no abdominal tenderness. There is no guarding or rebound.     Hernia: No hernia is present.  Musculoskeletal:     Cervical back: Normal range of motion and neck supple.  Lymphadenopathy:     Cervical: No cervical adenopathy.  Skin:    General: Skin is warm and dry.     Findings: No rash.  Neurological:     Mental Status: She is alert.     Cranial Nerves: No cranial nerve deficit.     Sensory: No sensory deficit.  Psychiatric:        Mood and Affect: Mood is not anxious or depressed.        Speech: Speech normal.        Behavior: Behavior normal. Behavior is cooperative.        Judgment: Judgment normal.    Diabetic foot exam: Normal inspection No skin breakdown No calluses  Normal DP pulses Normal sensation to light touch and monofilament Nails normal     Results for orders placed or performed in visit on 03/05/21  Comprehensive metabolic panel  Result Value Ref Range   Sodium 139 135 - 145 mEq/L   Potassium 4.3 3.5 - 5.1 mEq/L   Chloride 103 96 - 112 mEq/L   CO2 28 19 - 32 mEq/L   Glucose, Bld 104 (H) 70 - 99 mg/dL   BUN 13 6 - 23 mg/dL   Creatinine, Ser 0.89 0.40 - 1.20 mg/dL   Total Bilirubin 0.5 0.2 - 1.2 mg/dL   Alkaline Phosphatase 74 39 - 117 U/L   AST 20 0 - 37 U/L   ALT 28 0 - 35 U/L   Total Protein 6.8 6.0 - 8.3 g/dL   Albumin 4.3 3.5 - 5.2 g/dL   GFR 71.63 >60.00 mL/min   Calcium 9.5 8.4 - 10.5 mg/dL   Lipid panel  Result Value Ref Range   Cholesterol 195 0 - 200 mg/dL   Triglycerides 167.0 (H) 0.0 - 149.0 mg/dL   HDL 45.40 >39.00 mg/dL   VLDL 33.4 0.0 - 40.0 mg/dL   LDL Cholesterol 116 (H) 0 - 99 mg/dL  Total CHOL/HDL Ratio 4    NonHDL 149.26   Hemoglobin A1c  Result Value Ref Range   Hgb A1c MFr Bld 6.6 (H) 4.6 - 6.5 %    This visit occurred during the SARS-CoV-2 public health emergency.  Safety protocols were in place, including screening questions prior to the visit, additional usage of staff PPE, and extensive cleaning of exam room while observing appropriate contact time as indicated for disinfecting solutions.   COVID 19 screen:  No recent travel or known exposure to COVID19 The patient denies respiratory symptoms of COVID 19 at this time. The importance of social distancing was discussed today.   Assessment and Plan The patient's preventative maintenance and recommended screening tests for an annual wellness exam were reviewed in full today. Brought up to date unless services declined.  Counselled on the importance of diet, exercise, and its role in overall health and mortality. The patient's FH and SH was reviewed, including their home life, tobacco status, and drug and alcohol status.   Vaccines: COVID vaccine  x 4, consider shingrix Pap/DVE: 2017 last pap...GYN Dr. Leafy Ro S/P partial Hysterectomy for fibroids 03/2019 Mammo:  12/2019 Bone Density: no indication Colon:  04/26/2017, Dr. Gustavo Lah tubular adenoma repeat in 5 years Smoking Status: none ETOH/ drug use: none/none  Hep C:  done  HIV screen:   refused   Problem List Items Addressed This Visit     Controlled type 2 diabetes mellitus without complication, without long-term current use of insulin ([place])    Stable, chronic. Diet controlled. Encouraged exercise, weight loss, healthy eating habits.        Relevant Medications   losartan-hydrochlorothiazide (HYZAAR) 50-12.5 MG tablet   atorvastatin  (LIPITOR) 10 MG tablet   Hyperlipidemia associated with type 2 diabetes mellitus (HCC)    Chronic , inadequate control.  Unable to tolerate daily atorvastatin 10 mg.. try CoQ 10 or 1/2 tab daily.  re-eval in 3 months  Work on [employer], reviewed.      Relevant Medications   losartan-hydrochlorothiazide (HYZAAR) 50-12.5 MG tablet   atorvastatin (LIPITOR) 10 MG tablet   Hypertension associated with diabetes (HCC)    Stable, chronic. Good control at home.Continue current medication.   losartan HCTZ 50/12.5 mg daily      Relevant Medications   losartan-hydrochlorothiazide (HYZAAR) 50-12.5 MG tablet   atorvastatin (LIPITOR) 10 MG tablet   Other Visit Diagnoses     Routine general medical examination at a health care facility    -  Primary        Eliezer Lofts, MD

## 2021-04-19 ENCOUNTER — Ambulatory Visit: Payer: Self-pay | Admitting: Nurse Practitioner

## 2021-06-09 ENCOUNTER — Ambulatory Visit: Payer: Self-pay | Admitting: Nurse Practitioner

## 2021-09-05 IMAGING — MG DIGITAL SCREENING BILAT W/ TOMO W/ CAD
6 of 12 series · 6 of 36 positions shown · non-contrast
Comparison: Previous exam(s).

CLINICAL DATA: Screening.

EXAM:
DIGITAL SCREENING BILATERAL MAMMOGRAM WITH TOMO AND CAD

[R MLO synth-2D (1 of 2)]
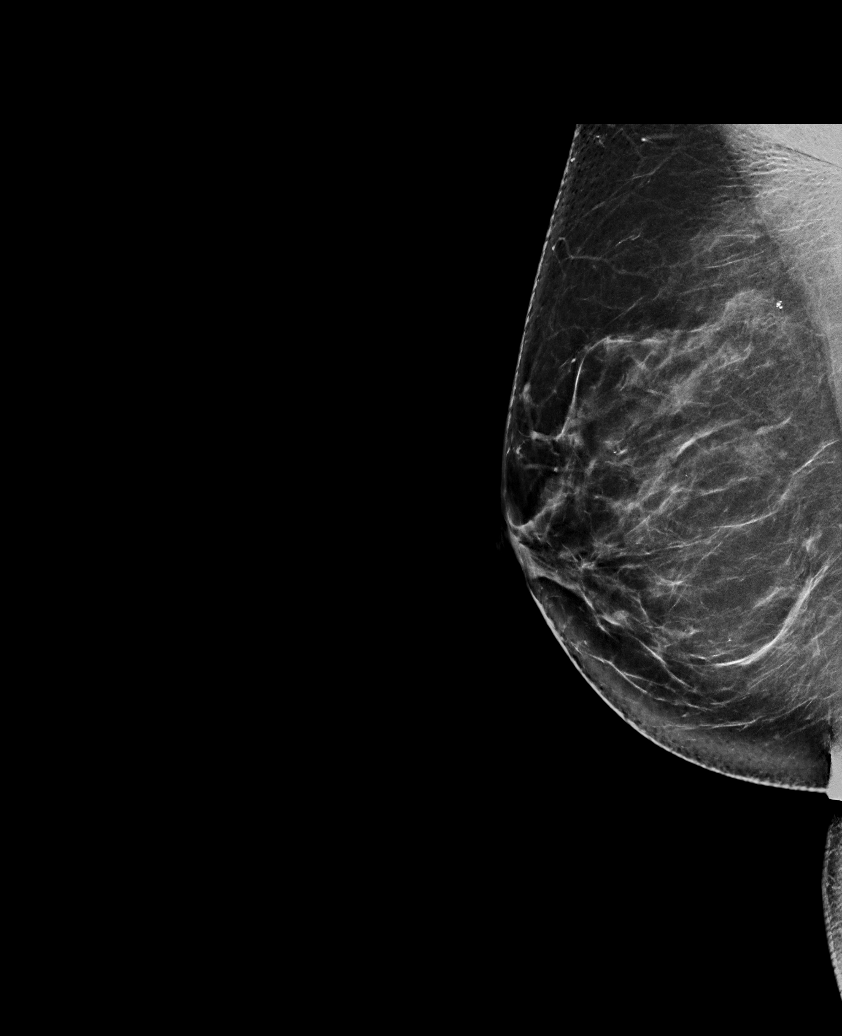

[R CC synth-2D]
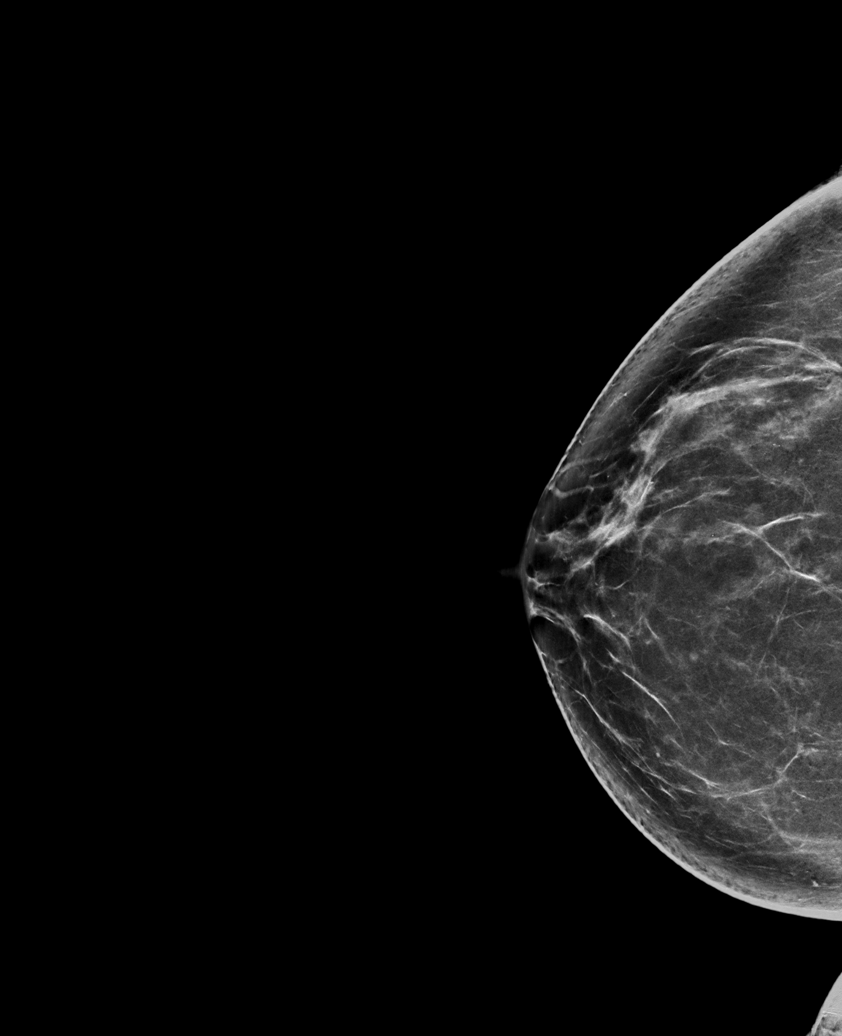

[L XCCL synth-2D]
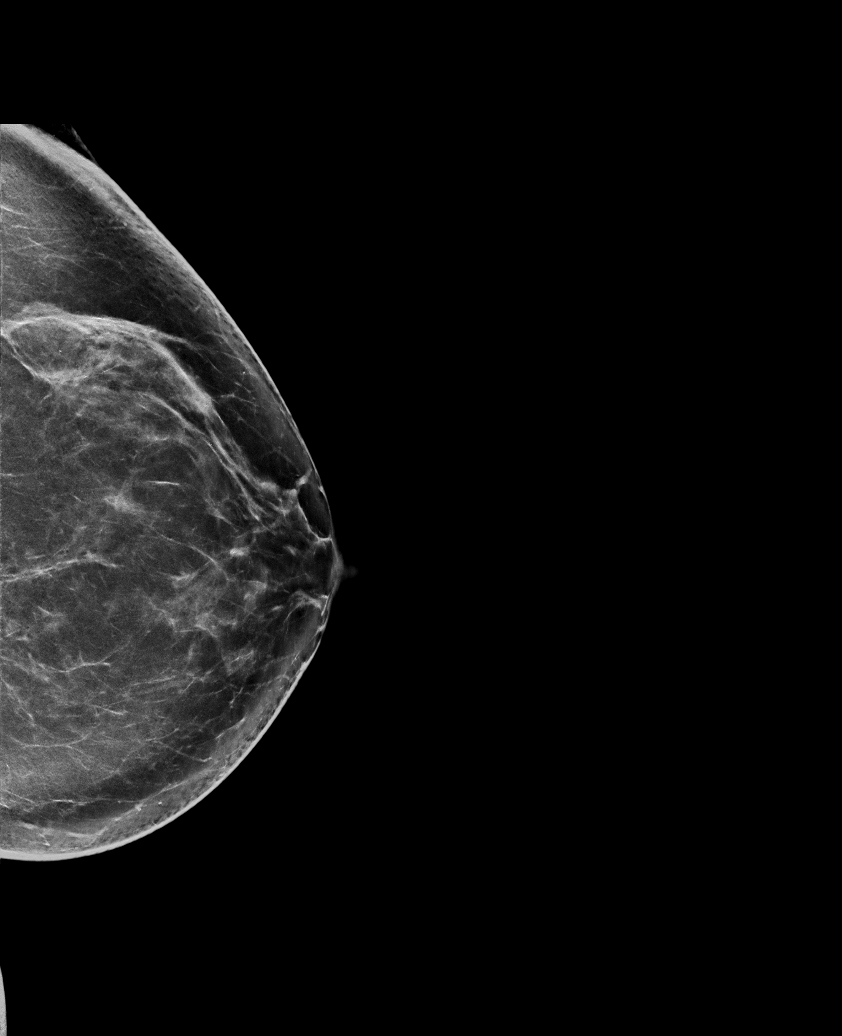

[L CC synth-2D]
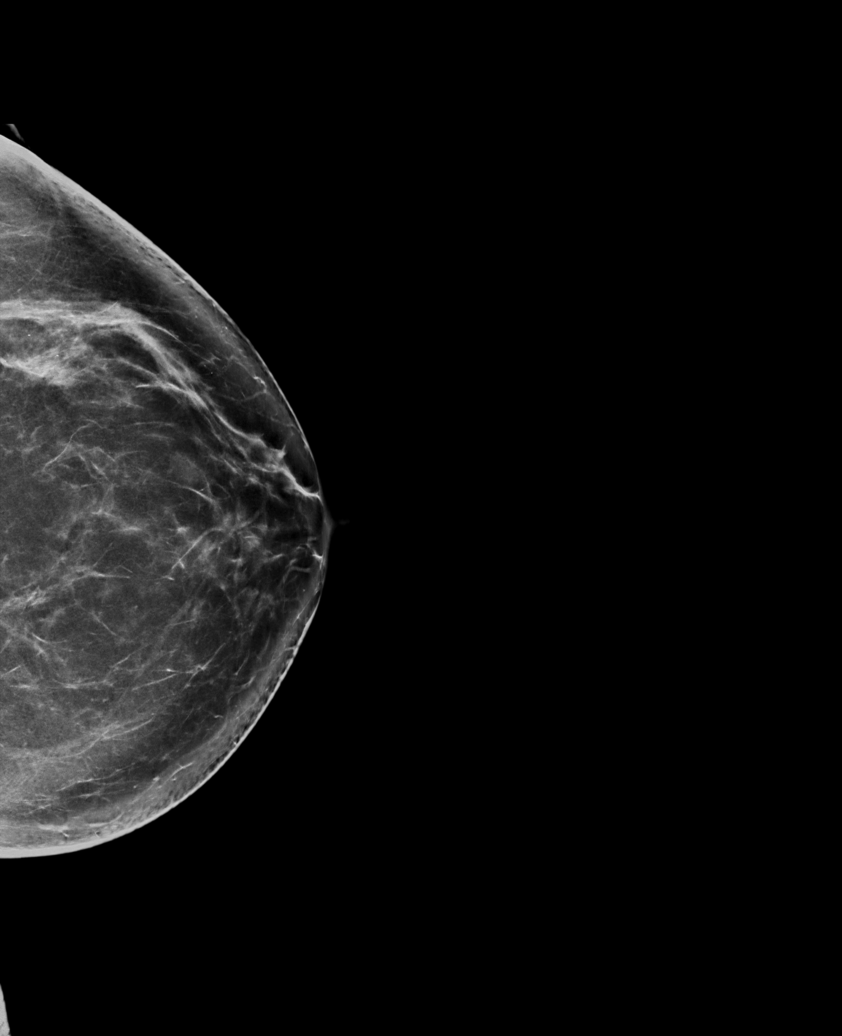

[R MLO synth-2D (2 of 2)]
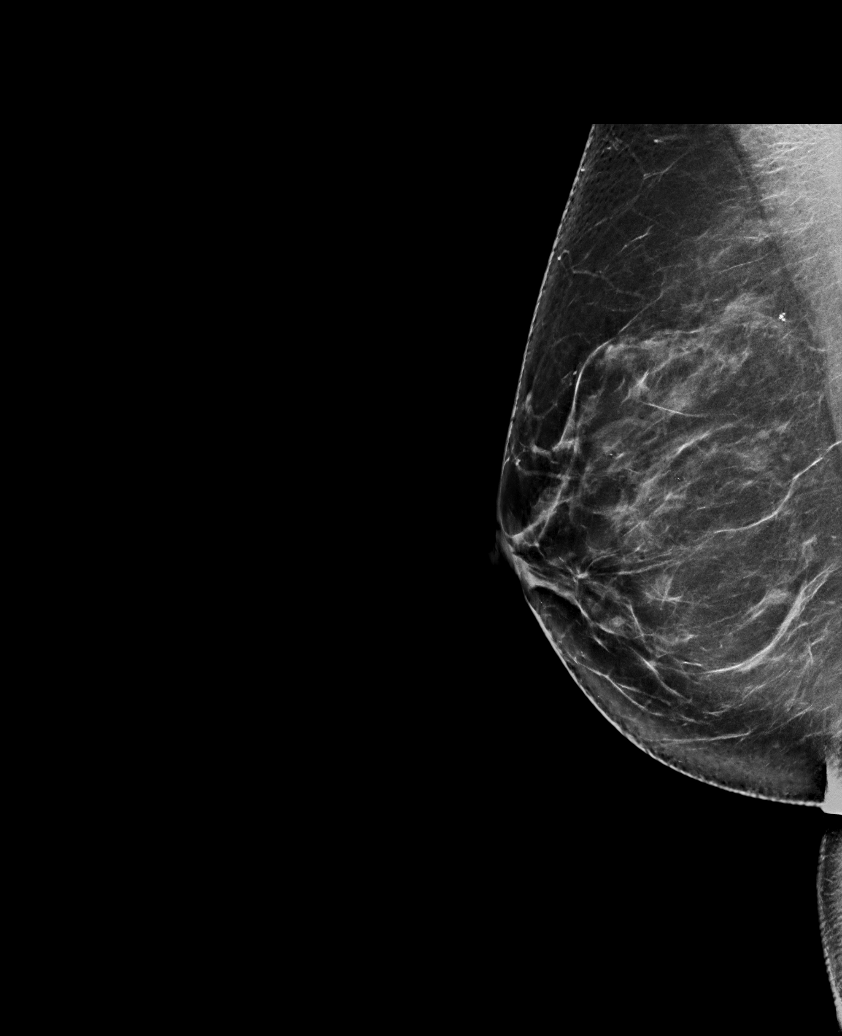

[L MLO synth-2D]
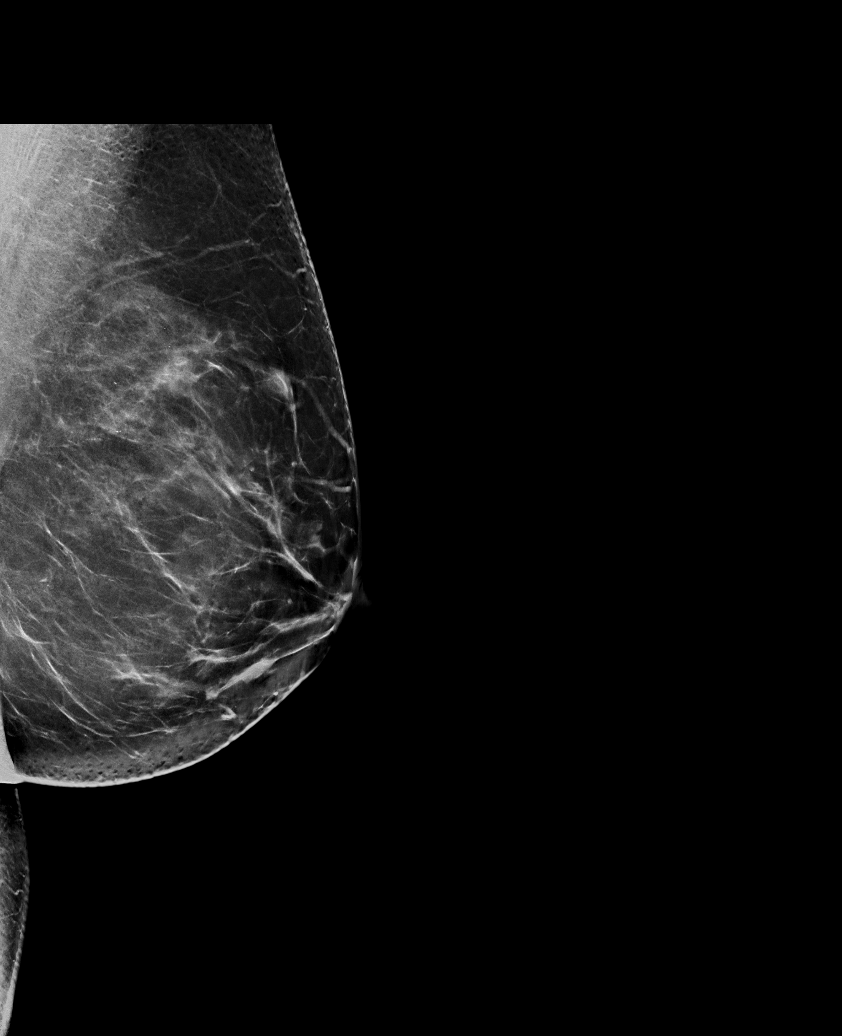

[6 of 36 positions shown; findings below may reference images not displayed]

ACR Breast Density Category b: There are scattered areas of
fibroglandular density.
FINDINGS: There are no findings suspicious for malignancy. Images were
processed with CAD.
IMPRESSION: No mammographic evidence of malignancy. A result letter of this
screening mammogram will be mailed directly to the patient.

RECOMMENDATION:
Screening mammogram in one year. (Code:[id number]-775)

BI-RADS CATEGORY  1: Negative.

## 2022-02-21 LAB — HM DIABETES FOOT EXAM

## 2022-03-09 ENCOUNTER — Other Ambulatory Visit: Payer: Self-pay | Admitting: Family Medicine

## 2022-03-09 NOTE — Telephone Encounter (Signed)
LVM for patient to call and schedule

## 2022-03-09 NOTE — Telephone Encounter (Signed)
Please schedule CPE with fasting labs prior for Dr. Bedsole. 

## 2022-03-11 ENCOUNTER — Telehealth: Payer: Self-pay | Admitting: Family Medicine

## 2022-03-11 DIAGNOSIS — D509 Iron deficiency anemia, unspecified: Secondary | ICD-10-CM

## 2022-03-11 DIAGNOSIS — E119 Type 2 diabetes mellitus without complications: Secondary | ICD-10-CM

## 2022-03-11 DIAGNOSIS — E1169 Type 2 diabetes mellitus with other specified complication: Secondary | ICD-10-CM

## 2022-03-11 NOTE — Telephone Encounter (Signed)
Patient is scheduled for her annual physical next Thursday 08/31 and her labs on 08/28. Just wanted to make aware because she have cpe labs on Monday. Thank you!

## 2022-03-11 NOTE — Telephone Encounter (Signed)
Please order CPE labs for Monday 03/14/22.

## 2022-03-11 NOTE — Addendum Note (Signed)
Addended by: Ellamae Sia on: 03/11/2022 03:58 PM   Modules accepted: Orders

## 2022-03-11 NOTE — Addendum Note (Signed)
Addended by: Eliezer Lofts E on: 03/11/2022 03:29 PM   Modules accepted: Orders

## 2022-03-14 ENCOUNTER — Other Ambulatory Visit (INDEPENDENT_AMBULATORY_CARE_PROVIDER_SITE_OTHER): Payer: BC Managed Care – PPO

## 2022-03-14 DIAGNOSIS — E1169 Type 2 diabetes mellitus with other specified complication: Secondary | ICD-10-CM

## 2022-03-14 DIAGNOSIS — D509 Iron deficiency anemia, unspecified: Secondary | ICD-10-CM | POA: Diagnosis not present

## 2022-03-14 DIAGNOSIS — E785 Hyperlipidemia, unspecified: Secondary | ICD-10-CM

## 2022-03-14 DIAGNOSIS — E119 Type 2 diabetes mellitus without complications: Secondary | ICD-10-CM | POA: Diagnosis not present

## 2022-03-14 LAB — IBC + FERRITIN
Ferritin: 91.3 ng/mL (ref 10.0–291.0)
Iron: 135 ug/dL (ref 42–145)
Saturation Ratios: 28.8 % (ref 20.0–50.0)
TIBC: 469 ug/dL — ABNORMAL HIGH (ref 250.0–450.0)
Transferrin: 335 mg/dL (ref 212.0–360.0)

## 2022-03-14 LAB — CBC WITH DIFFERENTIAL/PLATELET
Basophils Absolute: 0 10*3/uL (ref 0.0–0.1)
Basophils Relative: 0.4 % (ref 0.0–3.0)
Eosinophils Absolute: 0.1 10*3/uL (ref 0.0–0.7)
Eosinophils Relative: 1.4 % (ref 0.0–5.0)
HCT: 44.5 % (ref 36.0–46.0)
Hemoglobin: 14.9 g/dL (ref 12.0–15.0)
Lymphocytes Relative: 28.3 % (ref 12.0–46.0)
Lymphs Abs: 1.7 10*3/uL (ref 0.7–4.0)
MCHC: 33.6 g/dL (ref 30.0–36.0)
MCV: 83 fl (ref 78.0–100.0)
Monocytes Absolute: 0.5 10*3/uL (ref 0.1–1.0)
Monocytes Relative: 9.2 % (ref 3.0–12.0)
Neutro Abs: 3.5 10*3/uL (ref 1.4–7.7)
Neutrophils Relative %: 60.7 % (ref 43.0–77.0)
Platelets: 253 10*3/uL (ref 150.0–400.0)
RBC: 5.36 Mil/uL — ABNORMAL HIGH (ref 3.87–5.11)
RDW: 13.3 % (ref 11.5–15.5)
WBC: 5.8 10*3/uL (ref 4.0–10.5)

## 2022-03-14 LAB — COMPREHENSIVE METABOLIC PANEL
ALT: 31 U/L (ref 0–35)
AST: 22 U/L (ref 0–37)
Albumin: 4.5 g/dL (ref 3.5–5.2)
Alkaline Phosphatase: 83 U/L (ref 39–117)
BUN: 13 mg/dL (ref 6–23)
CO2: 30 mEq/L (ref 19–32)
Calcium: 9.6 mg/dL (ref 8.4–10.5)
Chloride: 102 mEq/L (ref 96–112)
Creatinine, Ser: 0.9 mg/dL (ref 0.40–1.20)
GFR: 70.17 mL/min (ref >60.00)
Glucose, Bld: 110 mg/dL — ABNORMAL HIGH (ref 70–99)
Potassium: 4.8 mEq/L (ref 3.5–5.1)
Sodium: 136 mEq/L (ref 135–145)
Total Bilirubin: 0.7 mg/dL (ref 0.2–1.2)
Total Protein: 6.7 g/dL (ref 6.0–8.3)

## 2022-03-14 LAB — LIPID PANEL
Cholesterol: 190 mg/dL (ref 0–200)
HDL: 39.9 mg/dL (ref >39.00)
LDL Cholesterol: 115 mg/dL — ABNORMAL HIGH (ref 0–99)
NonHDL: 149.76
Total CHOL/HDL Ratio: 5
Triglycerides: 175 mg/dL — ABNORMAL HIGH (ref 0.0–149.0)
VLDL: 35 mg/dL (ref 0.0–40.0)

## 2022-03-14 LAB — HEMOGLOBIN A1C: Hgb A1c MFr Bld: 6.9 % — ABNORMAL HIGH (ref 4.6–6.5)

## 2022-03-14 NOTE — Progress Notes (Signed)
No critical labs need to be addressed urgently. We will discuss labs in detail at upcoming office visit.   

## 2022-03-17 ENCOUNTER — Ambulatory Visit (INDEPENDENT_AMBULATORY_CARE_PROVIDER_SITE_OTHER): Payer: BC Managed Care – PPO | Admitting: Family Medicine

## 2022-03-17 ENCOUNTER — Encounter: Payer: Self-pay | Admitting: Family Medicine

## 2022-03-17 VITALS — BP 110/80 | HR 76 | Temp 97.9°F | Ht 63.5 in | Wt 150.2 lb

## 2022-03-17 DIAGNOSIS — E1169 Type 2 diabetes mellitus with other specified complication: Secondary | ICD-10-CM

## 2022-03-17 DIAGNOSIS — E119 Type 2 diabetes mellitus without complications: Secondary | ICD-10-CM | POA: Diagnosis not present

## 2022-03-17 DIAGNOSIS — Z0000 Encounter for general adult medical examination without abnormal findings: Secondary | ICD-10-CM | POA: Diagnosis not present

## 2022-03-17 DIAGNOSIS — E1159 Type 2 diabetes mellitus with other circulatory complications: Secondary | ICD-10-CM

## 2022-03-17 DIAGNOSIS — I152 Hypertension secondary to endocrine disorders: Secondary | ICD-10-CM

## 2022-03-17 DIAGNOSIS — Z23 Encounter for immunization: Secondary | ICD-10-CM | POA: Diagnosis not present

## 2022-03-17 DIAGNOSIS — E785 Hyperlipidemia, unspecified: Secondary | ICD-10-CM

## 2022-03-17 NOTE — Progress Notes (Signed)
Patient ID: Karla Boyer, female    DOB: 06-06-1961, 61 y.o.   MRN: [id number]  This visit was conducted in person.  BP 110/80   Pulse 76   Temp 97.9 F (36.6 C) (Oral)   Ht 5' 3.5" (1.613 m)   Wt 150 lb 4 oz (68.2 kg)   LMP 12/22/2018 (Exact Date) Comment: Preg test sent  SpO2 96%   BMI 26.20 kg/m    CC:  Chief Complaint  Patient presents with   Annual Exam    Subjective:   HPI: Karla Boyer is a 61 y.o. female presenting on 03/17/2022 for Annual Exam   Diabetes: Well-controlled with diet, slightly trending up over the last 3 months.. lots of vacation. Lab Results  Component Value Date   HGBA1C 6.9 (H) 03/14/2022  Using medications without difficulties: Hypoglycemic episodes: Hyperglycemic episodes: Feet problems: none Blood Sugars averaging: eye exam within last year: DUE  Elevated Cholesterol:  inadequate control on atorvastatin 10 mg daily Lab Results  Component Value Date   CHOL 190 03/14/2022   HDL 39.90 03/14/2022   LDLCALC 115 (H) 03/14/2022   TRIG 175.0 (H) 03/14/2022   CHOLHDL 5 03/14/2022  Using medications without problems: Muscle aches:  Diet compliance: heart healthy diet.. Exercise: walking a  lot at work Other complaints: Wt Readings from Last 3 Encounters:  03/17/22 150 lb 4 oz (68.2 kg)  03/09/21 150 lb 4 oz (68.2 kg)  12/06/19 148 lb 8 oz (67.4 kg)   Body mass index is 26.2 kg/m.   Hypertension:  At goal on losartan hydrochlorothiazide 50/12.5 mg p.o. daily  BP Readings from Last 3 Encounters:  03/17/22 110/80  03/09/21 140/88  12/06/19 120/80  Using medication without problems or lightheadedness:  none Chest pain with exertion: none Edema: none Short of breath: none Average home BPs: Other issues:       Relevant past medical, surgical, family and social history reviewed and updated as indicated. Interim medical history since our last visit reviewed. Allergies and medications reviewed and updated. Outpatient  Medications Prior to Visit  Medication Sig Dispense Refill   atorvastatin (LIPITOR) 10 MG tablet Take 1 tablet (10 mg total) by mouth daily. 90 tablet 3   losartan-hydrochlorothiazide (HYZAAR) 50-12.5 MG tablet TAKE 1 TABLET BY MOUTH EVERY DAY 90 tablet 0   omeprazole (PRILOSEC) 20 MG capsule Take 20 mg by mouth as needed.     No facility-administered medications prior to visit.     Per HPI unless specifically indicated in ROS section below Review of Systems  Constitutional:  Negative for fatigue and fever.  HENT:  Negative for congestion.   Eyes:  Negative for pain.  Respiratory:  Negative for cough and shortness of breath.   Cardiovascular:  Negative for chest pain, palpitations and leg swelling.  Gastrointestinal:  Negative for abdominal pain.  Genitourinary:  Negative for dysuria and vaginal bleeding.  Musculoskeletal:  Negative for back pain.  Neurological:  Negative for syncope, light-headedness and headaches.  Psychiatric/Behavioral:  Negative for dysphoric mood.    Objective:  BP 110/80   Pulse 76   Temp 97.9 F (36.6 C) (Oral)   Ht 5' 3.5" (1.613 m)   Wt 150 lb 4 oz (68.2 kg)   LMP 12/22/2018 (Exact Date) Comment: Preg test sent  SpO2 96%   BMI 26.20 kg/m   Wt Readings from Last 3 Encounters:  03/17/22 150 lb 4 oz (68.2 kg)  03/09/21 150 lb 4 oz (68.2 kg)  12/06/19  148 lb 8 oz (67.4 kg)      Physical Exam Vitals and nursing note reviewed.  Constitutional:      General: She is not in acute distress.    Appearance: Normal appearance. She is well-developed. She is not ill-appearing or toxic-appearing.  HENT:     Head: Normocephalic.     Right Ear: Hearing, tympanic membrane, ear canal and external ear normal.     Left Ear: Hearing, tympanic membrane, ear canal and external ear normal.     Nose: Nose normal.  Eyes:     General: Lids are normal. Lids are everted, no foreign bodies appreciated.     Conjunctiva/sclera: Conjunctivae normal.     Pupils: Pupils are  equal, round, and reactive to light.  Neck:     Thyroid: No thyroid mass or thyromegaly.     Vascular: No carotid bruit.     Trachea: Trachea normal.  Cardiovascular:     Rate and Rhythm: Normal rate and regular rhythm.     Heart sounds: Normal heart sounds, S1 normal and S2 normal. No murmur heard.    No gallop.  Pulmonary:     Effort: Pulmonary effort is normal. No respiratory distress.     Breath sounds: Normal breath sounds. No wheezing, rhonchi or rales.  Abdominal:     General: Bowel sounds are normal. There is no distension or abdominal bruit.     Palpations: Abdomen is soft. There is no fluid wave or mass.     Tenderness: There is no abdominal tenderness. There is no guarding or rebound.     Hernia: No hernia is present.  Musculoskeletal:     Cervical back: Normal range of motion and neck supple.  Lymphadenopathy:     Cervical: No cervical adenopathy.  Skin:    General: Skin is warm and dry.     Findings: No rash.  Neurological:     Mental Status: She is alert.     Cranial Nerves: No cranial nerve deficit.     Sensory: No sensory deficit.  Psychiatric:        Mood and Affect: Mood is not anxious or depressed.        Speech: Speech normal.        Behavior: Behavior normal. Behavior is cooperative.        Judgment: Judgment normal.       Diabetic foot exam: Normal inspection No skin breakdown No calluses  Normal DP pulses Normal sensation to light touch and monofilament Nails normal  Results for orders placed or performed in visit on 03/14/22  IBC + Ferritin  Result Value Ref Range   Iron 135 42 - 145 ug/dL   Transferrin 335.0 212.0 - 360.0 mg/dL   Saturation Ratios 28.8 20.0 - 50.0 %   Ferritin 91.3 10.0 - 291.0 ng/mL   TIBC 469.0 (H) 250.0 - 450.0 mcg/dL  CBC with Differential/Platelet  Result Value Ref Range   WBC 5.8 4.0 - 10.5 K/uL   RBC 5.36 (H) 3.87 - 5.11 Mil/uL   Hemoglobin 14.9 12.0 - 15.0 g/dL   HCT 44.5 36.0 - 46.0 %   MCV 83.0 78.0 - 100.0  fl   MCHC 33.6 30.0 - 36.0 g/dL   RDW 13.3 11.5 - 15.5 %   Platelets 253.0 150.0 - 400.0 K/uL   Neutrophils Relative % 60.7 43.0 - 77.0 %   Lymphocytes Relative 28.3 12.0 - 46.0 %   Monocytes Relative 9.2 3.0 - 12.0 %   Eosinophils Relative  1.4 0.0 - 5.0 %   Basophils Relative 0.4 0.0 - 3.0 %   Neutro Abs 3.5 1.4 - 7.7 K/uL   Lymphs Abs 1.7 0.7 - 4.0 K/uL   Monocytes Absolute 0.5 0.1 - 1.0 K/uL   Eosinophils Absolute 0.1 0.0 - 0.7 K/uL   Basophils Absolute 0.0 0.0 - 0.1 K/uL  Comprehensive metabolic panel  Result Value Ref Range   Sodium 136 135 - 145 mEq/L   Potassium 4.8 3.5 - 5.1 mEq/L   Chloride 102 96 - 112 mEq/L   CO2 30 19 - 32 mEq/L   Glucose, Bld 110 (H) 70 - 99 mg/dL   BUN 13 6 - 23 mg/dL   Creatinine, Ser 0.90 0.40 - 1.20 mg/dL   Total Bilirubin 0.7 0.2 - 1.2 mg/dL   Alkaline Phosphatase 83 39 - 117 U/L   AST 22 0 - 37 U/L   ALT 31 0 - 35 U/L   Total Protein 6.7 6.0 - 8.3 g/dL   Albumin 4.5 3.5 - 5.2 g/dL   GFR 70.17 >60.00 mL/min   Calcium 9.6 8.4 - 10.5 mg/dL  Lipid panel  Result Value Ref Range   Cholesterol 190 0 - 200 mg/dL   Triglycerides 175.0 (H) 0.0 - 149.0 mg/dL   HDL 39.90 >39.00 mg/dL   VLDL 35.0 0.0 - 40.0 mg/dL   LDL Cholesterol 115 (H) 0 - 99 mg/dL   Total CHOL/HDL Ratio 5    NonHDL 149.76   Hemoglobin A1c  Result Value Ref Range   Hgb A1c MFr Bld 6.9 (H) 4.6 - 6.5 %     COVID 19 screen:  No recent travel or known exposure to COVID19 The patient denies respiratory symptoms of COVID 19 at this time. The importance of social distancing was discussed today.   Assessment and Plan The patient's preventative maintenance and recommended screening tests for an annual wellness exam were reviewed in full today. Brought up to date unless services declined.  Counselled on the importance of diet, exercise, and its role in overall health and mortality. The patient's FH and SH was reviewed, including their home life, tobacco status, and drug and  alcohol status.   Vaccines: COVID vaccine  x 4, consider shingrix, gets flu vaccine at work Pap/DVE: 2017 last pap...GYN Dr. Leafy Ro S/P partial Hysterectomy for fibroids 03/2019 Mammo:  12/2019..due Bone Density: no indication.. mother with osteoporosis. Colon:  04/26/2017, Dr. Gustavo Lah tubular adenoma repeat in 5 years.. due in 04/2022 Smoking Status: none ETOH/ drug use: none/none  Hep C:  done  HIV screen:   refused   Problem List Items Addressed This Visit     Controlled type 2 diabetes mellitus without complication, without long-term current use of insulin ([place]) (Chronic)    Well-controlled with diet, slightly trending up over the last 3 months.. lots of vacation.      Relevant Orders   Lipid panel   Hemoglobin A1c   Hyperlipidemia associated with type 2 diabetes mellitus (HCC) (Chronic)    Inadequate control on atorvastatin 10 mg daily She is not interested in increasing her medication at this time.  She will work aggressively on low-cholesterol diet, regular exercise and weight management.      Relevant Orders   Lipid panel   Hemoglobin A1c   Hypertension associated with diabetes (HCC) (Chronic)    At goal on losartan hydrochlorothiazide 50/12.5 mg p.o. daily       Other Visit Diagnoses     Routine general medical examination at a  health care facility    -  Primary   Relevant Orders   Zoster Recombinant (Shingrix ) (Completed)   Need for shingles vaccine       Relevant Orders   Zoster Recombinant (Shingrix ) (Completed)      Problem List Items Addressed This Visit     Controlled type 2 diabetes mellitus without complication, without long-term current use of insulin ([place]) (Chronic)    Well-controlled with diet, slightly trending up over the last 3 months.. lots of vacation.      Relevant Orders   Lipid panel   Hemoglobin A1c   Hyperlipidemia associated with type 2 diabetes mellitus (HCC) (Chronic)    Inadequate control on atorvastatin 10 mg daily She is  not interested in increasing her medication at this time.  She will work aggressively on low-cholesterol diet, regular exercise and weight management.      Relevant Orders   Lipid panel   Hemoglobin A1c   Hypertension associated with diabetes (HCC) (Chronic)    At goal on losartan hydrochlorothiazide 50/12.5 mg p.o. daily       Other Visit Diagnoses     Routine general medical examination at a health care facility    -  Primary   Relevant Orders   Zoster Recombinant (Shingrix ) (Completed)   Need for shingles vaccine       Relevant Orders   Zoster Recombinant (Shingrix ) (Completed)        Eliezer Lofts, MD

## 2022-03-17 NOTE — Patient Instructions (Addendum)
Set up yearly eye exam for diabetes and have the opthalmologist send [place] a copy of the evaluation for the chart.  Work on low carb  and low cholesterol diet, increase exercise  Call to set up mammogram. Call GI to set up colonoscopy

## 2022-03-18 ENCOUNTER — Telehealth: Payer: Self-pay | Admitting: *Deleted

## 2022-03-18 NOTE — Telephone Encounter (Signed)
-----   Message from Jinny Sanders, MD sent at 03/18/2022  1:09 PM EDT ----- Agreed.  Let patient know she needs to return for diabetic eye exam when able.  Or call their office to determine if they did it. ----- Message ----- From: Carter Kitten, CMA Sent: 03/18/2022  11:20 AM EDT To: Jinny Sanders, MD  I don't thing DM eye exam was done.  Butch Penny ----- Message ----- From: Rosiland Oz Sent: 03/18/2022  11:08 AM EDT To: Carter Kitten, CMA

## 2022-03-18 NOTE — Telephone Encounter (Signed)
Received eye exam from [facility].  It does not appear they did a DM eye exam or have diabetes on her problem list.  I left message for Karla Boyer letting her know this and ask that she call them and schedule appointment to do just the diabetic eye exam.  I ask that she call me back if she has any questions.

## 2022-03-28 NOTE — Assessment & Plan Note (Signed)
At goal on losartan hydrochlorothiazide 50/12.5 mg p.o. daily  

## 2022-03-28 NOTE — Assessment & Plan Note (Signed)
Well-controlled with diet, slightly trending up over the last 3 months.. lots of vacation.

## 2022-03-28 NOTE — Assessment & Plan Note (Signed)
Inadequate control on atorvastatin 10 mg daily She is not interested in increasing her medication at this time.  She will work aggressively on low-cholesterol diet, regular exercise and weight management.

## 2022-05-04 ENCOUNTER — Other Ambulatory Visit: Payer: Self-pay | Admitting: Family Medicine

## 2022-05-04 DIAGNOSIS — Z1231 Encounter for screening mammogram for malignant neoplasm of breast: Secondary | ICD-10-CM

## 2022-05-23 ENCOUNTER — Ambulatory Visit
Admission: RE | Admit: 2022-05-23 | Discharge: 2022-05-23 | Disposition: A | Discharge status: Home / Self Care | Payer: BC Managed Care – PPO | Source: Ambulatory Visit | Attending: Family Medicine | Admitting: Family Medicine

## 2022-05-23 DIAGNOSIS — Z1231 Encounter for screening mammogram for malignant neoplasm of breast: Secondary | ICD-10-CM | POA: Diagnosis present

## 2022-06-12 ENCOUNTER — Other Ambulatory Visit: Payer: Self-pay | Admitting: Family Medicine

## 2022-08-29 ENCOUNTER — Telehealth: Payer: Self-pay | Admitting: Family Medicine

## 2022-08-29 NOTE — Telephone Encounter (Signed)
Patient is scheduled for a DM f/u and to discuss labs on 09/13/2022,she would like to know if that appointment can be virtual or do it have to be in person?

## 2022-08-30 NOTE — Telephone Encounter (Signed)
Yes this office visit can be virtual

## 2022-08-31 NOTE — Telephone Encounter (Signed)
Left message for Karla Boyer that her appointment on 09/13/22 has been changed to virtual.

## 2022-09-06 DIAGNOSIS — E119 Type 2 diabetes mellitus without complications: Secondary | ICD-10-CM

## 2022-09-07 NOTE — Telephone Encounter (Signed)
-----   Message from Velna Hatchet, RT sent at 08/22/2022  1:01 PM EST ----- Regarding: Thu 2/22 lab Future lab orders needed for a 6 month follow up appt, please.  Thanks, Anda Kraft

## 2022-09-08 ENCOUNTER — Other Ambulatory Visit (INDEPENDENT_AMBULATORY_CARE_PROVIDER_SITE_OTHER): Payer: BC Managed Care – PPO

## 2022-09-08 DIAGNOSIS — E119 Type 2 diabetes mellitus without complications: Secondary | ICD-10-CM

## 2022-09-09 NOTE — Progress Notes (Signed)
No critical labs need to be addressed urgently. We will discuss labs in detail at upcoming office visit.   

## 2022-09-10 LAB — MICROALBUMIN / CREATININE URINE RATIO
Creatinine, Urine: 42.7 mg/dL
Microalb/Creat Ratio: 8 mg/g creat (ref 0–29)
Microalbumin, Urine: 3.5 ug/mL

## 2022-09-10 LAB — COMPREHENSIVE METABOLIC PANEL
ALT: 26 IU/L (ref 0–32)
AST: 21 IU/L (ref 0–40)
Albumin/Globulin Ratio: 2.3 — ABNORMAL HIGH (ref 1.2–2.2)
Albumin: 4.6 g/dL (ref 3.8–4.9)
Alkaline Phosphatase: 88 IU/L (ref 44–121)
BUN/Creatinine Ratio: 15 (ref 9–23)
BUN: 13 mg/dL (ref 6–24)
Bilirubin Total: 0.4 mg/dL (ref 0.0–1.2)
CO2: 25 mmol/L (ref 20–29)
Calcium: 9.5 mg/dL (ref 8.7–10.2)
Chloride: 103 mmol/L (ref 96–106)
Creatinine, Ser: 0.86 mg/dL (ref 0.57–1.00)
Globulin, Total: 2 g/dL (ref 1.5–4.5)
Glucose: 111 mg/dL — ABNORMAL HIGH (ref 70–99)
Potassium: 4.8 mmol/L (ref 3.5–5.2)
Sodium: 141 mmol/L (ref 134–144)
Total Protein: 6.6 g/dL (ref 6.0–8.5)
eGFR: 78 mL/min/{1.73_m2} (ref >59)

## 2022-09-10 LAB — LIPID PANEL
Chol/HDL Ratio: 5 ratio — ABNORMAL HIGH (ref 0.0–4.4)
Cholesterol, Total: 214 mg/dL — ABNORMAL HIGH (ref 100–199)
HDL: 43 mg/dL (ref >39)
LDL Chol Calc (NIH): 140 mg/dL — ABNORMAL HIGH (ref 0–99)
Triglycerides: 173 mg/dL — ABNORMAL HIGH (ref 0–149)
VLDL Cholesterol Cal: 31 mg/dL (ref 5–40)

## 2022-09-10 LAB — HEMOGLOBIN A1C
Est. average glucose Bld gHb Est-mCnc: 154 mg/dL
Hgb A1c MFr Bld: 7 % — ABNORMAL HIGH (ref 4.8–5.6)

## 2022-09-13 ENCOUNTER — Encounter: Payer: Self-pay | Admitting: Family Medicine

## 2022-09-13 ENCOUNTER — Telehealth: Payer: BC Managed Care – PPO | Admitting: Family Medicine

## 2022-09-13 VITALS — BP 122/84 | HR 87 | Ht 63.5 in | Wt 146.0 lb

## 2022-09-13 DIAGNOSIS — I152 Hypertension secondary to endocrine disorders: Secondary | ICD-10-CM

## 2022-09-13 DIAGNOSIS — E119 Type 2 diabetes mellitus without complications: Secondary | ICD-10-CM | POA: Diagnosis not present

## 2022-09-13 DIAGNOSIS — E1169 Type 2 diabetes mellitus with other specified complication: Secondary | ICD-10-CM

## 2022-09-13 DIAGNOSIS — E785 Hyperlipidemia, unspecified: Secondary | ICD-10-CM

## 2022-09-13 DIAGNOSIS — E1159 Type 2 diabetes mellitus with other circulatory complications: Secondary | ICD-10-CM

## 2022-09-13 MED ORDER — BLOOD GLUCOSE MONITORING SUPPL DEVI: 1.0000 | Freq: Three times a day (TID) | 0 refills | Status: AC

## 2022-09-13 MED ORDER — LANCET DEVICE MISC: 1.0000 | Freq: Three times a day (TID) | 0 refills | Status: AC

## 2022-09-13 MED ORDER — LANCETS MISC. MISC: 1.0000 | Freq: Three times a day (TID) | 0 refills | Status: AC

## 2022-09-13 MED ORDER — BLOOD GLUCOSE TEST VI STRP: 1.0000 | ORAL_STRIP | Freq: Three times a day (TID) | 0 refills | Status: AC

## 2022-09-13 NOTE — Patient Instructions (Signed)
Start taking atorvastatin more consistently.. can try CoQ10 OTC for possible side effects.

## 2022-09-13 NOTE — Assessment & Plan Note (Signed)
Chronic, worsening control over the previous 6 months.  Currently treating with diet.

## 2022-09-13 NOTE — Assessment & Plan Note (Signed)
At goal on losartan hydrochlorothiazide 50/12.5 mg p.o. daily

## 2022-09-13 NOTE — Progress Notes (Unsigned)
VIRTUAL VISIT A virtual visit is felt to be most appropriate for this patient at this time.   I connected with the patient on 09/15/22 at  3:00 PM EST by virtual telehealth platform and verified that I am speaking with the correct person using two identifiers.   I discussed the limitations, risks, security and privacy concerns of performing an evaluation and management service by  virtual telehealth platform and the availability of in person appointments. I also discussed with the patient that there may be a patient responsible charge related to this service. The patient expressed understanding and agreed to proceed.  Patient location: Home Provider Location: [facility] Participants: Eliezer Lofts and [place]   Chief Complaint  Patient presents with   Diabetes    History of Present Illness:  62 y.o. female patient of Makira Holleman E, MD presents for diabetes follow up.  Diabetes:   Gradually worsening control over last 6 months. Lab Results  Component Value Date   HGBA1C 7.0 (H) 09/08/2022  Using medications without difficulties: Hypoglycemic episodes: Hyperglycemic episodes: Feet problems: no ulcers Blood Sugars averaging: not checking eye exam within last year:  Elevated Cholesterol: Inadequate control despite atorvastatin 10 mg daily.. was off for a while given headaches Lab Results  Component Value Date   CHOL 214 (H) 09/08/2022   HDL 43 09/08/2022   LDLCALC 140 (H) 09/08/2022   TRIG 173 (H) 09/08/2022   CHOLHDL 5.0 (H) 09/08/2022   The 10-year ASCVD risk score (Arnett DK, et al., 2019) is: 8.7%   Values used to calculate the score:     Age: 82 years     Sex: Female     Is Non-Hispanic African American: No     Diabetic: Yes     Tobacco smoker: No     Systolic Blood Pressure: 123XX123 mmHg     Is BP treated: Yes     HDL Cholesterol: 43 mg/dL     Total Cholesterol: 214 mg/dL Using medications without problems: Muscle aches:  Diet  compliance: Exercise: Other complaints:  Hypertension:  Well-controlled on losartan hydrochlorothiazide 50/12.5 mg daily  BP Readings from Last 3 Encounters:  09/13/22 122/84  03/17/22 110/80  03/09/21 140/88  Using medication without problems or lightheadedness:  Chest pain with exertion: Edema: Short of breath: Average home BPs: Other issues:   COVID 19 screen No recent travel or known exposure to [place] The patient denies respiratory symptoms of COVID 19 at this time.  The importance of social distancing was discussed today.   Review of Systems  Constitutional:  Negative for chills and fever.  HENT:  Negative for congestion and ear pain.   Eyes:  Negative for pain and redness.  Respiratory:  Negative for cough and shortness of breath.   Cardiovascular:  Negative for chest pain, palpitations and leg swelling.  Gastrointestinal:  Negative for abdominal pain, blood in stool, constipation, diarrhea, nausea and vomiting.  Genitourinary:  Negative for dysuria.  Musculoskeletal:  Negative for falls and myalgias.  Skin:  Negative for rash.  Neurological:  Negative for dizziness.  Psychiatric/Behavioral:  Negative for depression. The patient is not nervous/anxious.       Past Medical History:  Diagnosis Date   Anemia    Arthritis    thumb   Complication of anesthesia    GERD (gastroesophageal reflux disease)    occ    Hypertension    Other ganglion and cyst of synovium, tendon, and bursa(727.49)    PONV (postoperative nausea  and vomiting)    nausea   Unspecified tinnitus     reports that she has never smoked. She has never used smokeless tobacco. She reports that she does not drink alcohol and does not use drugs.   Current Outpatient Medications:    atorvastatin (LIPITOR) 10 MG tablet, Take 1 tablet (10 mg total) by mouth daily., Disp: 90 tablet, Rfl: 3   Blood Glucose Monitoring Suppl DEVI, 1 each by Does not apply route in the morning, at noon, and at bedtime. May  substitute to any manufacturer covered by patient's insurance., Disp: 1 each, Rfl: 0   Glucose Blood (BLOOD GLUCOSE TEST STRIPS) STRP, 1 each by In Vitro route in the morning, at noon, and at bedtime. May substitute to any manufacturer covered by patient's insurance., Disp: 100 strip, Rfl: 0   Lancet Device MISC, 1 each by Does not apply route in the morning, at noon, and at bedtime. May substitute to any manufacturer covered by patient's insurance., Disp: 1 each, Rfl: 0   Lancets Misc. MISC, 1 each by Does not apply route in the morning, at noon, and at bedtime. May substitute to any manufacturer covered by patient's insurance., Disp: 100 each, Rfl: 0   losartan-hydrochlorothiazide (HYZAAR) 50-12.5 MG tablet, TAKE 1 TABLET BY MOUTH EVERY DAY, Disp: 90 tablet, Rfl: 1   omeprazole (PRILOSEC) 20 MG capsule, Take 20 mg by mouth as needed., Disp: , Rfl:    Observations/Objective: Blood pressure 122/84, pulse 87, height 5' 3.5" (1.613 m), weight 146 lb (66.2 kg), last menstrual period 12/22/2018.  Physical Exam Constitutional:      General: The patient is not in acute distress. Pulmonary:     Effort: Pulmonary effort is normal. No respiratory distress.  Neurological:     Mental Status: The patient is alert and oriented to person, place, and time.  Psychiatric:        Mood and Affect: Mood normal.        Behavior: Behavior normal.    Assessment and Plan Hypertension associated with diabetes ([place]) Assessment & Plan: At goal on losartan hydrochlorothiazide 50/12.5 mg p.o. daily    Controlled type 2 diabetes mellitus without complication, without long-term current use of insulin (HCC) Assessment & Plan: Chronic, worsening control over the previous 6 months.  Currently treating with diet.   Hyperlipidemia associated with type 2 diabetes mellitus ([place]) Assessment & Plan: Chronic, inadequate control despite 10 mg atorvastatin daily   Other orders -     Blood Glucose Monitoring Suppl; 1  each by Does not apply route in the morning, at noon, and at bedtime. May substitute to any manufacturer covered by patient's insurance.  Dispense: 1 each; Refill: 0 -     Blood Glucose Test; 1 each by In Vitro route in the morning, at noon, and at bedtime. May substitute to any manufacturer covered by patient's insurance.  Dispense: 100 strip; Refill: 0 -     Lancet Device; 1 each by Does not apply route in the morning, at noon, and at bedtime. May substitute to any manufacturer covered by patient's insurance.  Dispense: 1 each; Refill: 0 -     Lancets Misc.; 1 each by Does not apply route in the morning, at noon, and at bedtime. May substitute to any manufacturer covered by patient's insurance.  Dispense: 100 each; Refill: 0      I discussed the assessment and treatment plan with the patient. The patient was provided an opportunity to ask questions and all were answered.  The patient agreed with the plan and demonstrated an understanding of the instructions.   The patient was advised to call back or seek an in-person evaluation if the symptoms worsen or if the condition fails to improve as anticipated.     Eliezer Lofts, MD

## 2022-09-13 NOTE — Assessment & Plan Note (Signed)
Chronic, inadequate control despite 10 mg atorvastatin daily

## 2022-09-15 ENCOUNTER — Ambulatory Visit: Payer: BC Managed Care – PPO | Admitting: Family Medicine

## 2022-12-09 ENCOUNTER — Other Ambulatory Visit: Payer: Self-pay | Admitting: Family Medicine

## 2023-01-23 ENCOUNTER — Ambulatory Visit: Payer: BC Managed Care – PPO

## 2023-01-23 DIAGNOSIS — Z86010 Personal history of colonic polyps: Secondary | ICD-10-CM

## 2023-01-23 DIAGNOSIS — K640 First degree hemorrhoids: Secondary | ICD-10-CM

## 2023-01-23 DIAGNOSIS — K635 Polyp of colon: Secondary | ICD-10-CM

## 2023-01-23 DIAGNOSIS — Z09 Encounter for follow-up examination after completed treatment for conditions other than malignant neoplasm: Secondary | ICD-10-CM

## 2023-03-18 ENCOUNTER — Other Ambulatory Visit: Payer: Self-pay | Admitting: Family Medicine

## 2023-03-21 NOTE — Telephone Encounter (Signed)
Please schedule CPE with fasting labs prior with Dr. Ermalene Searing.  Please send back to me once scheduled to refill medication.

## 2023-03-21 NOTE — Telephone Encounter (Signed)
Couldn't leave vm, sent mychart message

## 2023-03-23 NOTE — Telephone Encounter (Signed)
Contacted pt, couldn't leave vm.

## 2023-04-24 ENCOUNTER — Other Ambulatory Visit: Payer: Self-pay | Admitting: Family Medicine

## 2023-04-28 ENCOUNTER — Other Ambulatory Visit: Payer: Self-pay | Admitting: Family Medicine

## 2023-04-28 NOTE — Telephone Encounter (Signed)
LVM for patient to cb and schedule and sent mychart message to patient.

## 2023-04-28 NOTE — Telephone Encounter (Signed)
Patient needs appointment for CPE and labs once scheduled please send back for review for refill.

## 2023-04-28 NOTE — Telephone Encounter (Signed)
Lvm for patient tcb and schedule 

## 2023-04-28 NOTE — Telephone Encounter (Signed)
Patient is due for follow up. We have called and sent my chart but patient has not reached out to schedule. Ok to refill?

## 2023-05-30 ENCOUNTER — Telehealth: Payer: Self-pay | Admitting: *Deleted

## 2023-05-30 DIAGNOSIS — D509 Iron deficiency anemia, unspecified: Secondary | ICD-10-CM

## 2023-05-30 DIAGNOSIS — E119 Type 2 diabetes mellitus without complications: Secondary | ICD-10-CM

## 2023-05-30 DIAGNOSIS — E1169 Type 2 diabetes mellitus with other specified complication: Secondary | ICD-10-CM

## 2023-05-30 NOTE — Telephone Encounter (Signed)
-----   Message from Lovena Neighbours sent at 05/30/2023 11:47 AM EST ----- Regarding: Labs for Wednesday 11.27.24 Please put lab orders in future. Thank you, Denny Peon

## 2023-06-14 ENCOUNTER — Other Ambulatory Visit: Payer: BC Managed Care – PPO

## 2023-06-14 DIAGNOSIS — D509 Iron deficiency anemia, unspecified: Secondary | ICD-10-CM

## 2023-06-14 DIAGNOSIS — E785 Hyperlipidemia, unspecified: Secondary | ICD-10-CM

## 2023-06-14 DIAGNOSIS — E1169 Type 2 diabetes mellitus with other specified complication: Secondary | ICD-10-CM

## 2023-06-14 DIAGNOSIS — E119 Type 2 diabetes mellitus without complications: Secondary | ICD-10-CM | POA: Diagnosis not present

## 2023-06-14 LAB — IBC + FERRITIN
Ferritin: 84.5 ng/mL (ref 10.0–291.0)
Iron: 106 ug/dL (ref 42–145)
Saturation Ratios: 23.7 % (ref 20.0–50.0)
TIBC: 448 ug/dL (ref 250.0–450.0)
Transferrin: 320 mg/dL (ref 212.0–360.0)

## 2023-06-14 LAB — LIPID PANEL
Cholesterol: 198 mg/dL (ref 0–200)
HDL: 44.7 mg/dL (ref >39.00)
LDL Cholesterol: 128 mg/dL — ABNORMAL HIGH (ref 0–99)
NonHDL: 153.5
Total CHOL/HDL Ratio: 4
Triglycerides: 126 mg/dL (ref 0.0–149.0)
VLDL: 25.2 mg/dL (ref 0.0–40.0)

## 2023-06-14 LAB — CBC WITH DIFFERENTIAL/PLATELET
Basophils Absolute: 0 10*3/uL (ref 0.0–0.1)
Basophils Relative: 0.4 % (ref 0.0–3.0)
Eosinophils Absolute: 0.1 10*3/uL (ref 0.0–0.7)
Eosinophils Relative: 1.3 % (ref 0.0–5.0)
HCT: 44.5 % (ref 36.0–46.0)
Hemoglobin: 14.8 g/dL (ref 12.0–15.0)
Lymphocytes Relative: 33.7 % (ref 12.0–46.0)
Lymphs Abs: 1.7 10*3/uL (ref 0.7–4.0)
MCHC: 33.3 g/dL (ref 30.0–36.0)
MCV: 83.3 fL (ref 78.0–100.0)
Monocytes Absolute: 0.4 10*3/uL (ref 0.1–1.0)
Monocytes Relative: 8.1 % (ref 3.0–12.0)
Neutro Abs: 2.9 10*3/uL (ref 1.4–7.7)
Neutrophils Relative %: 56.5 % (ref 43.0–77.0)
Platelets: 251 10*3/uL (ref 150.0–400.0)
RBC: 5.35 Mil/uL — ABNORMAL HIGH (ref 3.87–5.11)
RDW: 13.2 % (ref 11.5–15.5)
WBC: 5.2 10*3/uL (ref 4.0–10.5)

## 2023-06-14 LAB — COMPREHENSIVE METABOLIC PANEL
ALT: 15 U/L (ref 0–35)
AST: 13 U/L (ref 0–37)
Albumin: 4.4 g/dL (ref 3.5–5.2)
Alkaline Phosphatase: 82 U/L (ref 39–117)
BUN: 13 mg/dL (ref 6–23)
CO2: 30 meq/L (ref 19–32)
Calcium: 9.5 mg/dL (ref 8.4–10.5)
Chloride: 103 meq/L (ref 96–112)
Creatinine, Ser: 0.84 mg/dL (ref 0.40–1.20)
GFR: 75.56 mL/min (ref >60.00)
Glucose, Bld: 105 mg/dL — ABNORMAL HIGH (ref 70–99)
Potassium: 4.9 meq/L (ref 3.5–5.1)
Sodium: 140 meq/L (ref 135–145)
Total Bilirubin: 0.5 mg/dL (ref 0.2–1.2)
Total Protein: 6.5 g/dL (ref 6.0–8.3)

## 2023-06-14 LAB — HEMOGLOBIN A1C: Hgb A1c MFr Bld: 6.6 % — ABNORMAL HIGH (ref 4.6–6.5)

## 2023-06-19 NOTE — Progress Notes (Signed)
No critical labs need to be addressed urgently. We will discuss labs in detail at upcoming office visit.   

## 2023-06-21 ENCOUNTER — Encounter: Payer: Self-pay | Admitting: Family Medicine

## 2023-06-21 ENCOUNTER — Ambulatory Visit (INDEPENDENT_AMBULATORY_CARE_PROVIDER_SITE_OTHER): Payer: BC Managed Care – PPO | Admitting: Family Medicine

## 2023-06-21 VITALS — BP 118/74 | HR 71 | Temp 98.1°F | Ht 63.5 in | Wt 142.2 lb

## 2023-06-21 DIAGNOSIS — E1169 Type 2 diabetes mellitus with other specified complication: Secondary | ICD-10-CM

## 2023-06-21 DIAGNOSIS — Z23 Encounter for immunization: Secondary | ICD-10-CM | POA: Diagnosis not present

## 2023-06-21 DIAGNOSIS — E785 Hyperlipidemia, unspecified: Secondary | ICD-10-CM

## 2023-06-21 DIAGNOSIS — Z862 Personal history of diseases of the blood and blood-forming organs and certain disorders involving the immune mechanism: Secondary | ICD-10-CM

## 2023-06-21 DIAGNOSIS — Z0000 Encounter for general adult medical examination without abnormal findings: Secondary | ICD-10-CM

## 2023-06-21 DIAGNOSIS — E1159 Type 2 diabetes mellitus with other circulatory complications: Secondary | ICD-10-CM

## 2023-06-21 DIAGNOSIS — I152 Hypertension secondary to endocrine disorders: Secondary | ICD-10-CM

## 2023-06-21 LAB — HM DIABETES FOOT EXAM

## 2023-06-21 MED ORDER — ATORVASTATIN CALCIUM 10 MG PO TABS: 5.0000 mg | ORAL_TABLET | ORAL | Status: AC

## 2023-06-21 NOTE — Assessment & Plan Note (Signed)
Chronic, now resolved.  Normal hemoglobin and iron levels.

## 2023-06-21 NOTE — Addendum Note (Signed)
Addended by: Leonor Liv on: 06/21/2023 10:10 AM   Modules accepted: Orders

## 2023-06-21 NOTE — Assessment & Plan Note (Signed)
Chronic, associated with diabetes  at goal on losartan hydrochlorothiazide 50/12.5 mg p.o. daily

## 2023-06-21 NOTE — Assessment & Plan Note (Addendum)
Well-controlled with diet change. Associated with hypertension circulatory complication.   Requested records for eye exam, foot exam (performed in office today) Up-to-date with urine microalbumin.

## 2023-06-21 NOTE — Assessment & Plan Note (Signed)
Chronic, inadequate control despite 10 mg atorvastatin daily.  Recommend increasing to 20 mg p.o. daily with reevaluation in 3 months.

## 2023-06-21 NOTE — Progress Notes (Signed)
Patient ID: Karla Boyer, female    DOB: 06-14-1961, 62 y.o.   MRN: [id number]  This visit was conducted in person.  BP 118/74   Pulse 71   Temp 98.1 F (36.7 C) (Oral)   Ht 5' 3.5" (1.613 m)   Wt 142 lb 3.2 oz (64.5 kg)   LMP 12/22/2018 (Exact Date) Comment: Preg test sent  SpO2 98%   BMI 24.79 kg/m    CC:  Chief Complaint  Patient presents with   Annual Exam    Subjective:   HPI: Karla Boyer is a 62 y.o. female presenting on 06/21/2023 for Annual Exam   Diabetes: Well-controlled with diet Has eliminate artificial sweetener. Lab Results  Component Value Date   HGBA1C 6.6 (H) 06/14/2023  Using medications without difficulties: Hypoglycemic episodes: none Hyperglycemic episodes: none Feet problems: none Blood Sugars averaging: Now checking with glucometer.Marland Kitchen  as needed eye exam within last year: yes  Elevated Cholesterol:  Improved but  inadequate control on atorvastatin 10 mg daily ( gives her HA so unable to take any) Lab Results  Component Value Date   CHOL 198 06/14/2023   HDL 44.70 06/14/2023   LDLCALC 128 (H) 06/14/2023   TRIG 126.0 06/14/2023   CHOLHDL 4 06/14/2023  Using medications without problems: Muscle aches:  Diet compliance: heart healthy diet.. Exercise: walking a  lot at work Other complaints: Wt Readings from Last 3 Encounters:  06/21/23 142 lb 3.2 oz (64.5 kg)  09/13/22 146 lb (66.2 kg)  03/17/22 150 lb 4 oz (68.2 kg)   Body mass index is 24.79 kg/m.   Hypertension:  At goal on losartan hydrochlorothiazide 50/12.5 mg p.o. daily  BP Readings from Last 3 Encounters:  06/21/23 118/74  09/13/22 122/84  03/17/22 110/80  Using medication without problems or lightheadedness:  none Chest pain with exertion: none Edema: none Short of breath: none Average home BPs: Other issues:       Relevant past medical, surgical, family and social history reviewed and updated as indicated. Interim medical history since our last visit  reviewed. Allergies and medications reviewed and updated. Outpatient Medications Prior to Visit  Medication Sig Dispense Refill   Blood Glucose Monitoring Suppl DEVI 1 each by Does not apply route in the morning, at noon, and at bedtime. May substitute to any manufacturer covered by patient's insurance. 1 each 0   losartan-hydrochlorothiazide (HYZAAR) 50-12.5 MG tablet TAKE 1 TABLET BY MOUTH EVERY DAY 30 tablet 0   atorvastatin (LIPITOR) 10 MG tablet Take 1 tablet (10 mg total) by mouth daily. 90 tablet 3   omeprazole (PRILOSEC) 20 MG capsule Take 20 mg by mouth as needed.     No facility-administered medications prior to visit.     Per HPI unless specifically indicated in ROS section below Review of Systems  Constitutional:  Negative for fatigue and fever.  HENT:  Negative for congestion.   Eyes:  Negative for pain.  Respiratory:  Negative for cough and shortness of breath.   Cardiovascular:  Negative for chest pain, palpitations and leg swelling.  Gastrointestinal:  Negative for abdominal pain.  Genitourinary:  Negative for dysuria and vaginal bleeding.  Musculoskeletal:  Negative for back pain.  Neurological:  Negative for syncope, light-headedness and headaches.  Psychiatric/Behavioral:  Negative for dysphoric mood.    Objective:  BP 118/74   Pulse 71   Temp 98.1 F (36.7 C) (Oral)   Ht 5' 3.5" (1.613 m)   Wt 142 lb 3.2 oz (64.5  kg)   LMP 12/22/2018 (Exact Date) Comment: Preg test sent  SpO2 98%   BMI 24.79 kg/m   Wt Readings from Last 3 Encounters:  06/21/23 142 lb 3.2 oz (64.5 kg)  09/13/22 146 lb (66.2 kg)  03/17/22 150 lb 4 oz (68.2 kg)      Physical Exam Vitals and nursing note reviewed.  Constitutional:      General: She is not in acute distress.    Appearance: Normal appearance. She is well-developed. She is not ill-appearing or toxic-appearing.  HENT:     Head: Normocephalic.     Right Ear: Hearing, tympanic membrane, ear canal and external ear normal.      Left Ear: Hearing, tympanic membrane, ear canal and external ear normal.     Nose: Nose normal.  Eyes:     General: Lids are normal. Lids are everted, no foreign bodies appreciated.     Conjunctiva/sclera: Conjunctivae normal.     Pupils: Pupils are equal, round, and reactive to light.  Neck:     Thyroid: No thyroid mass or thyromegaly.     Vascular: No carotid bruit.     Trachea: Trachea normal.  Cardiovascular:     Rate and Rhythm: Normal rate and regular rhythm.     Heart sounds: Normal heart sounds, S1 normal and S2 normal. No murmur heard.    No gallop.  Pulmonary:     Effort: Pulmonary effort is normal. No respiratory distress.     Breath sounds: Normal breath sounds. No wheezing, rhonchi or rales.  Abdominal:     General: Bowel sounds are normal. There is no distension or abdominal bruit.     Palpations: Abdomen is soft. There is no fluid wave or mass.     Tenderness: There is no abdominal tenderness. There is no guarding or rebound.     Hernia: No hernia is present.  Musculoskeletal:     Cervical back: Normal range of motion and neck supple.  Lymphadenopathy:     Cervical: No cervical adenopathy.  Skin:    General: Skin is warm and dry.     Findings: No rash.  Neurological:     Mental Status: She is alert.     Cranial Nerves: No cranial nerve deficit.     Sensory: No sensory deficit.  Psychiatric:        Mood and Affect: Mood is not anxious or depressed.        Speech: Speech normal.        Behavior: Behavior normal. Behavior is cooperative.        Judgment: Judgment normal.       Diabetic foot exam: Normal inspection No skin breakdown No calluses  Normal DP pulses Normal sensation to light touch and monofilament Nails normal  Results for orders placed or performed in visit on 06/21/23  HM DIABETES FOOT EXAM  Result Value Ref Range   HM Diabetic Foot Exam Done      COVID 19 screen:  No recent travel or known exposure to COVID19 The patient denies  respiratory symptoms of COVID 19 at this time. The importance of social distancing was discussed today.   Assessment and Plan The patient's preventative maintenance and recommended screening tests for an annual wellness exam were reviewed in full today. Brought up to date unless services declined.  Counselled on the importance of diet, exercise, and its role in overall health and mortality. The patient's FH and SH was reviewed, including their home life, tobacco status, and drug  and alcohol status.   Vaccines: COVID vaccine  x 4, consider shingrix, gets flu vaccine at work, due for tdap... given shingrix and Td today. Pap/DVE: 2017 last pap...GYN Dr. Dalbert Garnet S/P partial Hysterectomy for fibroids 03/2019 Mammo: 05/2023 Bone Density: consider  next year with mammogram.. mother with osteoporosis. 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 Smoking Status: none ETOH/ drug use: none/none  Hep C:  done  HIV screen:   refused   Problem List Items Addressed This Visit     History of anemia    Chronic, now resolved.  Normal hemoglobin and iron levels.      Hyperlipidemia associated with type 2 diabetes mellitus (HCC) (Chronic)    Chronic, inadequate control despite 10 mg atorvastatin daily.  Recommend increasing to 20 mg p.o. daily with reevaluation in 3 months.      Relevant Medications   atorvastatin (LIPITOR) 10 MG tablet   Hypertension associated with diabetes (HCC) (Chronic)    Chronic, associated with diabetes  at goal on losartan hydrochlorothiazide 50/12.5 mg p.o. daily       Relevant Medications   atorvastatin (LIPITOR) 10 MG tablet   Type 2 diabetes mellitus with other circulatory complications (HCC)    Well-controlled with diet change. Associated with hypertension circulatory complication.   Requested records for eye exam, foot exam (performed in office today) Up-to-date with urine microalbumin.      Relevant Medications   atorvastatin (LIPITOR)  10 MG tablet   Other Visit Diagnoses     Routine general medical examination at a health care facility    -  Primary       Problem List Items Addressed This Visit     History of anemia    Chronic, now resolved.  Normal hemoglobin and iron levels.      Hyperlipidemia associated with type 2 diabetes mellitus (HCC) (Chronic)    Chronic, inadequate control despite 10 mg atorvastatin daily.  Recommend increasing to 20 mg p.o. daily with reevaluation in 3 months.      Relevant Medications   atorvastatin (LIPITOR) 10 MG tablet   Hypertension associated with diabetes (HCC) (Chronic)    Chronic, associated with diabetes  at goal on losartan hydrochlorothiazide 50/12.5 mg p.o. daily       Relevant Medications   atorvastatin (LIPITOR) 10 MG tablet   Type 2 diabetes mellitus with other circulatory complications (HCC)    Well-controlled with diet change. Associated with hypertension circulatory complication.   Requested records for eye exam, foot exam (performed in office today) Up-to-date with urine microalbumin.      Relevant Medications   atorvastatin (LIPITOR) 10 MG tablet   Other Visit Diagnoses     Routine general medical examination at a health care facility    -  Primary         Kerby Nora, MD

## 2023-06-21 NOTE — Patient Instructions (Signed)
Try 1/2 tab of atorvastatin to see if any associated HA... if not try to take every other day or daily.  Continue working on low cholesterol diet.

## 2023-07-04 ENCOUNTER — Encounter: Payer: Self-pay | Admitting: Family Medicine

## 2023-07-04 NOTE — Telephone Encounter (Signed)
 Care team updated and letter sent for eye exam notes.

## 2023-10-21 ENCOUNTER — Other Ambulatory Visit: Payer: Self-pay | Admitting: Family Medicine

## 2023-10-21 DIAGNOSIS — E1169 Type 2 diabetes mellitus with other specified complication: Secondary | ICD-10-CM

## 2023-10-21 DIAGNOSIS — E1159 Type 2 diabetes mellitus with other circulatory complications: Secondary | ICD-10-CM

## 2023-10-23 ENCOUNTER — Other Ambulatory Visit: Payer: Self-pay | Admitting: Family Medicine

## 2023-10-23 DIAGNOSIS — E1159 Type 2 diabetes mellitus with other circulatory complications: Secondary | ICD-10-CM

## 2023-10-23 DIAGNOSIS — E1169 Type 2 diabetes mellitus with other specified complication: Secondary | ICD-10-CM

## 2023-10-23 NOTE — Telephone Encounter (Signed)
  Please call and schedule diabetes follow up with fasting lab prior(CMET, lipids and A1C)

## 2023-10-23 NOTE — Telephone Encounter (Signed)
 LVM for patient to c/b and schedule.

## 2023-10-24 NOTE — Telephone Encounter (Signed)
 LVM for pt to cb and sent mychart message.
